# Patient Record
Sex: Female | Born: 1970
Health system: Southern US, Community
[De-identification: ages and names within clinical notes are randomized; demographics above are authoritative.]

## PROBLEM LIST (undated history)

## (undated) DIAGNOSIS — F458 Other somatoform disorders: Secondary | ICD-10-CM

## (undated) DIAGNOSIS — N200 Calculus of kidney: Secondary | ICD-10-CM

## (undated) DIAGNOSIS — Z87442 Personal history of urinary calculi: Secondary | ICD-10-CM

## (undated) DIAGNOSIS — G43909 Migraine, unspecified, not intractable, without status migrainosus: Secondary | ICD-10-CM

## (undated) DIAGNOSIS — T7840XA Allergy, unspecified, initial encounter: Secondary | ICD-10-CM

## (undated) DIAGNOSIS — F419 Anxiety disorder, unspecified: Secondary | ICD-10-CM

## (undated) DIAGNOSIS — R011 Cardiac murmur, unspecified: Secondary | ICD-10-CM

## (undated) DIAGNOSIS — M1990 Unspecified osteoarthritis, unspecified site: Secondary | ICD-10-CM

## (undated) DIAGNOSIS — M419 Scoliosis, unspecified: Secondary | ICD-10-CM

## (undated) HISTORY — DX: Calculus of kidney: N20.0

## (undated) HISTORY — DX: Anxiety disorder, unspecified: F41.9

## (undated) HISTORY — DX: Other somatoform disorders: F45.8

## (undated) HISTORY — DX: Allergy, unspecified, initial encounter: T78.40XA

## (undated) HISTORY — PX: EYE SURGERY: SHX253

## (undated) HISTORY — PX: TUBAL LIGATION: SHX77

## (undated) HISTORY — DX: Migraine, unspecified, not intractable, without status migrainosus: G43.909

## (undated) HISTORY — DX: Unspecified osteoarthritis, unspecified site: M19.90

## (undated) HISTORY — DX: Scoliosis, unspecified: M41.9

## (undated) HISTORY — DX: Cardiac murmur, unspecified: R01.1

## (undated) HISTORY — PX: WISDOM TOOTH EXTRACTION: SHX21

---
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
## 2016-03-13 ENCOUNTER — Encounter: Payer: Self-pay | Admitting: Emergency Medicine

## 2016-03-13 ENCOUNTER — Emergency Department: Payer: BLUE CROSS/BLUE SHIELD

## 2016-03-13 ENCOUNTER — Emergency Department
Admission: EM | Admit: 2016-03-13 | Discharge: 2016-03-13 | Disposition: A | Discharge status: Home / Self Care | Payer: BLUE CROSS/BLUE SHIELD | Attending: Emergency Medicine | Admitting: Emergency Medicine

## 2016-03-13 DIAGNOSIS — D259 Leiomyoma of uterus, unspecified: Secondary | ICD-10-CM | POA: Diagnosis not present

## 2016-03-13 DIAGNOSIS — N939 Abnormal uterine and vaginal bleeding, unspecified: Secondary | ICD-10-CM | POA: Diagnosis present

## 2016-03-13 LAB — URINALYSIS COMPLETE WITH MICROSCOPIC (ARMC ONLY)
BILIRUBIN URINE: NEGATIVE
Glucose, UA: NEGATIVE mg/dL
LEUKOCYTES UA: NEGATIVE
NITRITE: POSITIVE — AB
PH: 6 (ref 5.0–8.0)
Protein, ur: NEGATIVE mg/dL
SPECIFIC GRAVITY, URINE: 1.016 (ref 1.005–1.030)

## 2016-03-13 LAB — COMPREHENSIVE METABOLIC PANEL
ALT: 12 U/L — ABNORMAL LOW (ref 14–54)
AST: 19 U/L (ref 15–41)
Albumin: 4.4 g/dL (ref 3.5–5.0)
Alkaline Phosphatase: 41 U/L (ref 38–126)
Anion gap: 9 (ref 5–15)
BUN: 13 mg/dL (ref 6–20)
CHLORIDE: 105 mmol/L (ref 101–111)
CO2: 23 mmol/L (ref 22–32)
Calcium: 9.3 mg/dL (ref 8.9–10.3)
Creatinine, Ser: 0.69 mg/dL (ref 0.44–1.00)
Glucose, Bld: 91 mg/dL (ref 65–99)
POTASSIUM: 3.5 mmol/L (ref 3.5–5.1)
Sodium: 137 mmol/L (ref 135–145)
Total Bilirubin: 0.7 mg/dL (ref 0.3–1.2)
Total Protein: 7.4 g/dL (ref 6.5–8.1)

## 2016-03-13 LAB — POCT PREGNANCY, URINE: Preg Test, Ur: NEGATIVE

## 2016-03-13 LAB — CBC
HEMATOCRIT: 40.7 % (ref 35.0–47.0)
Hemoglobin: 13.4 g/dL (ref 12.0–16.0)
MCH: 30.9 pg (ref 26.0–34.0)
MCHC: 33 g/dL (ref 32.0–36.0)
MCV: 93.7 fL (ref 80.0–100.0)
PLATELETS: 230 10*3/uL (ref 150–440)
RBC: 4.34 MIL/uL (ref 3.80–5.20)
RDW: 12.7 % (ref 11.5–14.5)
WBC: 6.5 10*3/uL (ref 3.6–11.0)

## 2016-03-13 LAB — LIPASE, BLOOD: LIPASE: 29 U/L (ref 11–51)

## 2016-03-13 MED ORDER — OXYCODONE-ACETAMINOPHEN 5-325 MG PO TABS
1.0000 | ORAL_TABLET | Freq: Once | ORAL | Status: AC
  Administered 2016-03-13: 1 via ORAL
  Filled 2016-03-13: qty 1

## 2016-03-13 MED ORDER — TRAMADOL HCL 50 MG PO TABS: 50.0000 mg | ORAL_TABLET | Freq: Four times a day (QID) | ORAL | 0 refills | Status: AC | PRN

## 2016-03-13 NOTE — Discharge Instructions (Signed)
Please seek medical attention for any high fevers, chest pain, shortness of breath, change in behavior, persistent vomiting, bloody stool or any other new or concerning symptoms.  

## 2016-03-13 NOTE — ED Provider Notes (Signed)
[facility] Emergency Department Provider Note   ____________________________________________   I have reviewed the triage vital signs and the nursing notes.   HISTORY  Chief Complaint Abdominal Pain   History limited by: Not Limited   HPI Jacqueline Wong is a 45 y.o. female who presents to the emergency department today because of concern for vaginal bleeding and abdominal pain.She states that the pain is located in her lower abdomin. She has been having significant pain there. She last had a period roughly 10 months ago and presumed that she was going through menopause (also having hot flashes). States the pain is worse than it had been when she was having regular periods. She denies any dysuria or problems with defecation. No fevers.   History reviewed. No pertinent past medical history.  There are no active problems to display for this patient.   History reviewed. No pertinent surgical history.  Prior to Admission medications   Not on File    Allergies Review of patient's allergies indicates no known allergies.  History reviewed. No pertinent family history.  Social History Social History  Substance Use Topics  . Smoking status: Never Smoker  . Smokeless tobacco: Never Used  . Alcohol use Yes    Review of Systems  Constitutional: Negative for fever. Cardiovascular: Negative for chest pain. Respiratory: Negative for shortness of breath. Gastrointestinal: Positive for lower abdominal pain. Genitourinary: Negative for dysuria. Positive for vaginal bleeding. Musculoskeletal: Negative for back pain. Skin: Negative for rash. Neurological: Negative for headaches, focal weakness or numbness.  10-point ROS otherwise negative.  ____________________________________________   PHYSICAL EXAM:  VITAL SIGNS: ED Triage Vitals  Enc Vitals Group     BP 03/13/16 1728 120/77     Pulse Rate 03/13/16 1728 77     Resp 03/13/16 1728 18      Temp 03/13/16 1728 98.4 F (36.9 C)     Temp Source 03/13/16 1728 Oral     SpO2 03/13/16 1728 100 %     Weight 03/13/16 1729 110 lb (49.9 kg)     Height 03/13/16 1729 4\' 9"  (1.448 m)     Head Circumference --      Peak Flow --      Pain Score 03/13/16 1730 5   Constitutional: Alert and oriented. Well appearing and in no distress. Eyes: Conjunctivae are normal. Normal extraocular movements. ENT   Head: Normocephalic and atraumatic.   Nose: No congestion/rhinnorhea.   Mouth/Throat: Mucous membranes are moist.   Neck: No stridor. Hematological/Lymphatic/Immunilogical: No cervical lymphadenopathy. Cardiovascular: Normal rate, regular rhythm.  No murmurs, rubs, or gallops.  Respiratory: Normal respiratory effort without tachypnea nor retractions. Breath sounds are clear and equal bilaterally. No wheezes/rales/rhonchi. Gastrointestinal: Soft and tender to palpation in the lower abdomen. Genitourinary: Deferred Musculoskeletal: Normal range of motion in all extremities. No lower extremity edema. Neurologic:  Normal speech and language. No gross focal neurologic deficits are appreciated.  Skin:  Skin is warm, dry and intact. No rash noted. Psychiatric: Mood and affect are normal. Speech and behavior are normal. Patient exhibits appropriate insight and judgment.  ____________________________________________    LABS (pertinent positives/negatives)  Labs Reviewed  COMPREHENSIVE METABOLIC PANEL - Abnormal; Notable for the following:       Result Value   ALT 12 (*)    All other components within normal limits  URINALYSIS COMPLETEWITH MICROSCOPIC ([facility] ONLY) - Abnormal; Notable for the following:    Color, Urine YELLOW (*)    APPearance CLEAR (*)  Ketones, ur 1+ (*)    Hgb urine dipstick 3+ (*)    Nitrite POSITIVE (*)    Bacteria, UA MANY (*)    Squamous Epithelial / LPF 0-5 (*)    All other components within normal limits  LIPASE, BLOOD  CBC  POC URINE PREG, ED   POCT PREGNANCY, URINE     ____________________________________________   EKG  None  ____________________________________________    RADIOLOGY  US IMPRESSION:  1. Heterogeneous mass within the anterior uterine body/fundus,  likely an intramural fibroid.  2. Otherwise normal examination of the pelvis.      ____________________________________________   PROCEDURES  Procedures  ____________________________________________   INITIAL IMPRESSION / ASSESSMENT AND PLAN / ED COURSE  Pertinent labs & imaging results that were available during my care of the patient were reviewed by me and considered in my medical decision making (see chart for details).  [place] consistent with fibroid. Think this likely source of bleeding. hgb wnl. Vital signs without tachycardia or hypotension. Will give patient ob/gyn follow up information. Discussed return precautions with the patient.  ____________________________________________   FINAL CLINICAL IMPRESSION(S) / ED DIAGNOSES  Final diagnoses:  Vaginal bleeding  Uterine leiomyoma, unspecified location     Note: This dictation was prepared with Dragon dictation. Any transcriptional errors that result from this process are unintentional    Nance Pear, MD 03/13/16 2232

## 2016-03-13 NOTE — ED Notes (Signed)
Pt in via triage with complaints of pelvic and vaginal pain x 4 days, pt denies any itching/burning, denies any vaginal discharge.  Pt A/Ox4, no immediate distress noted at this time.

## 2016-03-13 NOTE — ED Triage Notes (Addendum)
Pt to ed with c/o vaginal bleeding and abd pain x 4 days.  States she has change pad x 5 today, however 2 days ago was changing pads every 20 minutes. Last period was 10 months prior to today. Reports nausea denies vomiting, denies diarrhea.

## 2016-03-15 ENCOUNTER — Encounter: Payer: Self-pay | Admitting: Obstetrics and Gynecology

## 2016-03-15 ENCOUNTER — Ambulatory Visit (INDEPENDENT_AMBULATORY_CARE_PROVIDER_SITE_OTHER): Payer: BLUE CROSS/BLUE SHIELD | Admitting: Obstetrics and Gynecology

## 2016-03-15 VITALS — BP 104/68 | HR 73 | Ht <= 58 in | Wt 108.8 lb

## 2016-03-15 DIAGNOSIS — N3001 Acute cystitis with hematuria: Secondary | ICD-10-CM | POA: Diagnosis not present

## 2016-03-15 DIAGNOSIS — R102 Pelvic and perineal pain unspecified side: Secondary | ICD-10-CM

## 2016-03-15 DIAGNOSIS — Z124 Encounter for screening for malignant neoplasm of cervix: Secondary | ICD-10-CM

## 2016-03-15 DIAGNOSIS — N939 Abnormal uterine and vaginal bleeding, unspecified: Secondary | ICD-10-CM

## 2016-03-15 MED ORDER — ACETAMINOPHEN-CODEINE #3 300-30 MG PO TABS: 1.0000 | ORAL_TABLET | Freq: Four times a day (QID) | ORAL | 0 refills | Status: DC | PRN

## 2016-03-15 MED ORDER — IBUPROFEN 800 MG PO TABS: 800.0000 mg | ORAL_TABLET | Freq: Three times a day (TID) | ORAL | 1 refills | Status: DC | PRN

## 2016-03-15 NOTE — Patient Instructions (Signed)

## 2016-03-15 NOTE — Progress Notes (Signed)
GYNECOLOGY CLINIC PROGRESS NOTE  Subjective:      Jacqueline Wong is an 45 y.o. [id number] woman who presents as a follow up from the Emergency Room 2 days ago for perimenopausal vs postmenopausal bleeding and pelvic pain. Patient notes that she has not had a menstrual cycle in ~ 10-12 months (unsure).  Prior to this, she was having 2-3 periods per year in the year prior. Pain has had a more sudden onset, occurring in the past 3-4 days. Patient notes she was told that she had fibroids, and could be the cause of her issues.  History of abnormal Pap smear: no.   Patient has not had a pap smear in "many years".  Denies urinary or bowel symptoms.   Menstrual History: OB History    Gravida Para Term Preterm AB Living   3       1     SAB TAB Ectopic Multiple Live Births   1       2      Menarche age: 62 Patient's last menstrual period was 03/13/2016.   Past Medical History:  Diagnosis Date  . Migraine     Family History  Problem Relation Age of Onset  . Thyroid cancer Sister    Past Surgical History:  Procedure Laterality Date  . CESAREAN SECTION     x 2  . TUBAL LIGATION      Social History   Social History  . Marital status: Married    Spouse name: N/A  . Number of children: N/A  . Years of education: N/A   Occupational History  . Not on file.   Social History Main Topics  . Smoking status: Never Smoker  . Smokeless tobacco: Never Used  . Alcohol use Yes  . Drug use: No  . Sexual activity: Yes    Birth control/ protection: Surgical   Other Topics Concern  . Not on file   Social History Narrative  . No narrative on file    Current Outpatient Prescriptions on File Prior to Visit  Medication Sig Dispense Refill  . traMADol (ULTRAM) 50 MG tablet Take 1 tablet (50 mg total) by mouth every 6 (six) hours as needed. 15 tablet 0   No current facility-administered medications on file prior to visit.     No Known Allergies   Review of Systems Pertinent  items noted in HPI and remainder of comprehensive ROS otherwise negative.    Objective:    BP 104/68 (BP Location: Left Arm, Patient Position: Sitting, Cuff Size: Normal)   Pulse 73   Ht 4\' 9"  (1.448 m)   Wt 108 lb 12.8 oz (49.4 kg)   LMP 03/13/2016   BMI 23.54 kg/m   General:   alert and no distress  Skin:    normal and no rash or abnormalities  Neck:  no adenopathy, no carotid bruit, no JVD, supple, symmetrical, trachea midline and thyroid not enlarged, symmetric, no tenderness/mass/nodules  Abdomen:  normal findings: bowel sounds normal, no masses palpable and soft and abnormal findings:  mild tenderness in the lower abdomen  Pelvic:  external genitalia normal, rectovaginal septum normal.  Vagina without discharge. Mild vaginal atrophy.  Cervix normal appearing, no lesions and no motion tenderness, stenotic os.  Uterus mobile, nontender, normal shape and size.  Adnexae non-palpable, nontender bilaterally.          Last labs:  Lab Results  Component Value Date   WBC 6.5 03/13/2016   HGB 13.4 03/13/2016  HCT 40.7 03/13/2016   MCV 93.7 03/13/2016   PLT 230 03/13/2016   Lab Results  Component Value Date   CREATININE 0.69 03/13/2016   BUN 13 03/13/2016   NA 137 03/13/2016   K 3.5 03/13/2016   CL 105 03/13/2016   CO2 23 03/13/2016    Urinalysis    Component Value Date/Time   COLORURINE YELLOW (A) 03/13/2016 1742   APPEARANCEUR CLEAR (A) 03/13/2016 1742   LABSPEC 1.016 03/13/2016 1742   PHURINE 6.0 03/13/2016 1742   GLUCOSEU NEGATIVE 03/13/2016 1742   HGBUR 3+ (A) 03/13/2016 1742   BILIRUBINUR NEGATIVE 03/13/2016 1742   KETONESUR 1+ (A) 03/13/2016 1742   PROTEINUR NEGATIVE 03/13/2016 1742   NITRITE POSITIVE (A) 03/13/2016 1742   LEUKOCYTESUR NEGATIVE 03/13/2016 1742     CLINICAL DATA:  Vaginal bleeding and pain for 45 days. First menstrual cycle in 10 months.  EXAM: TRANSABDOMINAL AND TRANSVAGINAL ULTRASOUND OF PELVIS  DOPPLER ULTRASOUND OF  OVARIES  TECHNIQUE: Both transabdominal and transvaginal ultrasound examinations of the pelvis were performed. Transabdominal technique was performed for global imaging of the pelvis including uterus, ovaries, adnexal regions, and pelvic cul-de-sac.  It was necessary to proceed with endovaginal exam following the transabdominal exam to visualize the uterus, endometrium, both ovaries and both adnexae. Color and duplex Doppler ultrasound was utilized to evaluate blood flow to the ovaries.  COMPARISON:  None.  FINDINGS: Uterus  Measurements: 8.5 x 4.4 x 6.0 cm. There is a heterogeneous focal area in the anterior uterine body measuring 1.9 x 1.5 x 2.2 cm.  Endometrium  Thickness: 6 mm.  No focal abnormality visualized.  Right ovary  Measurements: 2.4 x 1.5 x 1.6 cm. Normal appearance/no adnexal mass.  Left ovary  Measurements: 3.3 x 1.7 x 1.5 cm. Normal appearance/no adnexal mass.  Pulsed Doppler evaluation of both ovaries demonstrates normal low-resistance arterial and venous waveforms.  Other findings  No abnormal free fluid.  IMPRESSION: 1. Heterogeneous mass within the anterior uterine body/fundus, likely an intramural fibroid. 2. Otherwise normal examination of the pelvis.  Assessment:   Perimenopausal bleeding vs postmenopausal bleeding   Pelvic pain Fibroid uterus Nitrite positive UA   Plan:    - Blood tests: Estradiol, FSH, LH, Progesterone level and TSH. Will assess if patient is perimenopausal or postmenopausal.  If postmenopausal, patient may need further evaluation with endometrial biopsy.  If perimenopausal, may just be a menstrual cycle and no further workup needed. Can manage with supplemental hormonal therapy.    - Patient has not had a pap smear in many years.  Performed today.  - Nitrite positive on UA from ER. Can treat with Cipro.  May be cause of her pelvic pain.  Also given prescription for Vicodin as patient notes Tramadol did not help  with pain.  - Fibroid uterus, small fibroid noted, appears intramural, could contribute to abnormal bleeding if perimenopausal.   - Will notify patient of results by phone.    Rubie Maid, MD [facility]

## 2016-03-17 LAB — ESTRADIOL: Estradiol: 28.9 pg/mL

## 2016-03-17 LAB — FSH/LH
FSH: 5.7 m[IU]/mL
LH: 4.9 m[IU]/mL

## 2016-03-17 LAB — PROGESTERONE: Progesterone: 0.2 ng/mL

## 2016-03-17 LAB — TSH: TSH: 1.99 u[IU]/mL (ref 0.450–4.500)

## 2016-03-19 ENCOUNTER — Telehealth: Payer: Self-pay

## 2016-03-19 DIAGNOSIS — R399 Unspecified symptoms and signs involving the genitourinary system: Secondary | ICD-10-CM

## 2016-03-19 MED ORDER — NITROFURANTOIN MONOHYD MACRO 100 MG PO CAPS: 100.0000 mg | ORAL_CAPSULE | Freq: Two times a day (BID) | ORAL | 0 refills | Status: DC

## 2016-03-19 NOTE — Telephone Encounter (Signed)
Called pt informed her of information below, pt states that she is still in a lot of pain and does not believe that this level of pain would be caused by a UTI. Advised pt to come by and get RX for pain meds as she states she was not aware it was up front. Advised pt that we would treat her for UTI and if she was still having pain in a week to call back. Pt gave verbal understanding. RX for macrobid sent in.

## 2016-03-19 NOTE — Telephone Encounter (Signed)
-----   Message from Rubie Maid, MD sent at 03/19/2016  1:16 PM EDT ----- Please inform patient that her labs don't look quite like she has hit menopause yet, but are a fairly low (meaning she will likely be transitioning in the near future).  If she would like.  So she likely will not require any further workup. If she is having other symptoms like hot flushes, night sweats, she could be placed on supplemental hormones to boost her current levels, but if she is otherwise asymptomatic outside of the bleeding then she does not require any hormonal supplementation. Also, please inform patient of Nitrite positive urine test from the Emergency Room, she needs to be treated for a UTI (may be the cause of her pelvic pain).

## 2016-03-21 LAB — PAP IG AND HPV HIGH-RISK
HPV, HIGH-RISK: NEGATIVE
PAP Smear Comment: 0

## 2016-03-26 ENCOUNTER — Other Ambulatory Visit: Payer: Self-pay

## 2016-03-26 ENCOUNTER — Telehealth: Payer: Self-pay | Admitting: Obstetrics and Gynecology

## 2016-03-26 DIAGNOSIS — N858 Other specified noninflammatory disorders of uterus: Secondary | ICD-10-CM

## 2016-03-26 MED ORDER — ESTRADIOL 1 MG PO TABS: 1.0000 mg | ORAL_TABLET | Freq: Every day | ORAL | 0 refills | Status: DC

## 2016-03-26 NOTE — Telephone Encounter (Signed)
Thi spt called you back, she said she was at work and could not anser her phone. Please call her back.

## 2016-03-26 NOTE — Telephone Encounter (Signed)
See previous telephone encounter.

## 2016-03-26 NOTE — Telephone Encounter (Signed)
Pt called and was here last week or the week before and she had a UTI and she was given an antibiotic and she is still having the symptoms and wanted to know what she needs to do.

## 2016-03-26 NOTE — Telephone Encounter (Signed)
Called pt no answer. Unable to leave message as no voicemail is set up.  

## 2016-03-26 NOTE — Telephone Encounter (Signed)
Called pt had lengthy discussion with pt on Dr.Cherry's recommendation of taking Estridial 1mg  for 2 weeks to see if that will help resolve pt's pelvic pain. Pt states she will try 2 week trial of medication, pt also notes that she never came and got rx for pain medication, pt states she will come get it today. Pt also requests letter for work as she is in a lot of pain. Will provide. Pt also still c/o dysuria will leave urine sample for TOC also discussed with pt that she will likely need referral to urology.

## 2016-03-27 ENCOUNTER — Other Ambulatory Visit: Payer: Self-pay | Admitting: Obstetrics and Gynecology

## 2016-03-27 ENCOUNTER — Other Ambulatory Visit: Payer: Self-pay

## 2016-03-27 DIAGNOSIS — Z87440 Personal history of urinary (tract) infections: Secondary | ICD-10-CM

## 2016-03-28 ENCOUNTER — Telehealth: Payer: Self-pay | Admitting: Obstetrics and Gynecology

## 2016-03-28 DIAGNOSIS — Z7689 Persons encountering health services in other specified circumstances: Secondary | ICD-10-CM

## 2016-03-28 DIAGNOSIS — R1030 Lower abdominal pain, unspecified: Secondary | ICD-10-CM

## 2016-03-28 NOTE — Telephone Encounter (Signed)
Called pt she states that she has not had a bowel movement in about a week and a half. Pt states that she has tried OTC stool softeners with no relief. Advised pt on the use of warm prune juice, or fleet enema for immediate relief. Pt states that she will comply with this treatment, pt understands that if she does not have a BM today she needs to be seen in the ED. Pt also requests referral for GI and PCP. Will place.

## 2016-03-28 NOTE — Telephone Encounter (Signed)
Pt called and she is not sure what she needs to day, she said Dr Marcelline Mates is the only doctor she sees and she has not had a BM in 1 weeks, she has taken OTC stool softner and nothing is working, and she is unable to go to work due to the pain in her pelvic region and extending to her back so she is needing a work note.

## 2016-03-29 ENCOUNTER — Other Ambulatory Visit: Payer: BLUE CROSS/BLUE SHIELD

## 2016-03-29 ENCOUNTER — Other Ambulatory Visit: Payer: Self-pay

## 2016-03-29 ENCOUNTER — Telehealth: Payer: Self-pay | Admitting: Obstetrics and Gynecology

## 2016-03-29 DIAGNOSIS — Z87440 Personal history of urinary (tract) infections: Secondary | ICD-10-CM

## 2016-03-29 NOTE — Telephone Encounter (Signed)
Pt called and left office voicemail requesting a call back from you.

## 2016-03-29 NOTE — Telephone Encounter (Signed)
Called pt she states that she is still having a lot of back pain and wants the results of urine cx, informed pt that urine cx was sent out just this morning due to lab error. Pt gave verbal understanding. Offered pt appointment she declines. Will call back with urine cx results when they return.

## 2016-03-30 LAB — URINE CULTURE: Organism ID, Bacteria: NO GROWTH

## 2016-04-02 ENCOUNTER — Ambulatory Visit
Admission: RE | Admit: 2016-04-02 | Discharge: 2016-04-02 | Disposition: A | Discharge status: Home / Self Care | Payer: BLUE CROSS/BLUE SHIELD | Source: Ambulatory Visit | Attending: Unknown Physician Specialty | Admitting: Unknown Physician Specialty

## 2016-04-02 ENCOUNTER — Other Ambulatory Visit: Payer: Self-pay | Admitting: Unknown Physician Specialty

## 2016-04-02 ENCOUNTER — Telehealth: Payer: Self-pay

## 2016-04-02 DIAGNOSIS — R300 Dysuria: Secondary | ICD-10-CM

## 2016-04-02 DIAGNOSIS — R109 Unspecified abdominal pain: Secondary | ICD-10-CM

## 2016-04-02 DIAGNOSIS — N200 Calculus of kidney: Secondary | ICD-10-CM | POA: Insufficient documentation

## 2016-04-02 DIAGNOSIS — K5730 Diverticulosis of large intestine without perforation or abscess without bleeding: Secondary | ICD-10-CM | POA: Diagnosis not present

## 2016-04-02 NOTE — Telephone Encounter (Signed)
Sent pt mychart message informing her of negative urine culture.

## 2016-04-04 ENCOUNTER — Ambulatory Visit: Payer: BLUE CROSS/BLUE SHIELD | Admitting: Gastroenterology

## 2016-04-05 ENCOUNTER — Ambulatory Visit (INDEPENDENT_AMBULATORY_CARE_PROVIDER_SITE_OTHER): Payer: BLUE CROSS/BLUE SHIELD | Admitting: Urology

## 2016-04-05 ENCOUNTER — Encounter: Payer: Self-pay | Admitting: Urology

## 2016-04-05 VITALS — BP 131/84 | HR 93 | Ht <= 58 in | Wt 106.6 lb

## 2016-04-05 DIAGNOSIS — N200 Calculus of kidney: Secondary | ICD-10-CM

## 2016-04-05 DIAGNOSIS — M4180 Other forms of scoliosis, site unspecified: Secondary | ICD-10-CM | POA: Diagnosis not present

## 2016-04-05 DIAGNOSIS — N3946 Mixed incontinence: Secondary | ICD-10-CM | POA: Diagnosis not present

## 2016-04-05 LAB — URINALYSIS, COMPLETE
BILIRUBIN UA: NEGATIVE
Glucose, UA: NEGATIVE
KETONES UA: NEGATIVE
LEUKOCYTES UA: NEGATIVE
NITRITE UA: NEGATIVE
PH UA: 6 (ref 5.0–7.5)
Protein, UA: NEGATIVE
SPEC GRAV UA: 1.01 (ref 1.005–1.030)
UUROB: 1 mg/dL (ref 0.2–1.0)

## 2016-04-05 LAB — MICROSCOPIC EXAMINATION

## 2016-04-05 LAB — BLADDER SCAN AMB NON-IMAGING: SCAN RESULT: 60

## 2016-04-05 NOTE — Progress Notes (Signed)
04/05/2016 2:13 PM   Jacqueline Wong 1970/12/23 [id number]  Referring provider: Lorenza Evangelist, MD [address] [place], [place] [address]  Chief Complaint  Patient presents with  . New Patient (Initial Visit)    Kidney stones referred by Dr. Sabra Heck    HPI: Patient is a 45 year old Caucasian female who is referred by Dr. Sabra Heck for nephrolithiasis.  Patient states the onset of the pain was sudden and started two weeks ago.   She states the pain was sharp and lasted for several hours.  The pain was located in the left lower back and did not radiate.     The pain was a 10/10.  Nothing made the pain better.   Movement made the pain worse causing a sharp shooting sensation.    She did not have gross hematuria, fevers, chills, nausea or vomiting.  In the ED, she was given Percocet and Ultram.  Her UA in the ED was nitrite positive with many bacteria.    Her serum creatinine was 0.69 mg/dL.    CT renal stone study performed on 04/02/2016 noted intrarenal calculi on the left. No hydronephrosis or ureteral calculus on the left.  Probable leiomyomatous uterus with lobulation along the anterior rightward aspect of the uterus.  No bowel obstruction. No abscess. Occasional sigmoid diverticula without diverticulitis. Appendix region appears normal.  Question sludge in gallbladder.  Gallbladder wall not thickened.  I have independently reviewed the films.  Today, she is experiencing urinary urgency, dysuria, nocturia 1, mixed urinary incontinence.  Her incontinence is light as she uses 2 panty liners a day..  Her UA was unremarkable.  She was rubbing her left buttock walking to the restroom for a PVR.  This makes me think that this may be musculoskeletal in nature, such as a sciatic pain.    She does have a prior history of stones.  She states when she was 16, she had a basket procedure and they pushed the stone back up.    PMH: Past Medical History:    Diagnosis Date  . Anxiety   . Heart murmur   . Kidney stone   . Migraine     Surgical History: Past Surgical History:  Procedure Laterality Date  . CESAREAN SECTION     x 2  . TUBAL LIGATION      Home Medications:    Medication List       Accurate as of 04/05/16  2:13 PM. Always use your most recent med list.          acetaminophen-codeine 300-30 MG tablet Commonly known as:  TYLENOL #3 Take 1-2 tablets by mouth every 6 (six) hours as needed for moderate pain.   estradiol 1 MG tablet Commonly known as:  ESTRACE Take 1 tablet (1 mg total) by mouth daily.   HYDROcodone-acetaminophen 5-325 MG tablet Commonly known as:  NORCO/VICODIN Take by mouth.   ibuprofen 800 MG tablet Commonly known as:  ADVIL,MOTRIN Take 1 tablet (800 mg total) by mouth every 8 (eight) hours as needed.   nitrofurantoin (macrocrystal-monohydrate) 100 MG capsule Commonly known as:  MACROBID Take 1 capsule (100 mg total) by mouth 2 (two) times daily.   promethazine 25 MG tablet Commonly known as:  PHENERGAN Take by mouth.   traMADol 50 MG tablet Commonly known as:  ULTRAM Take 1 tablet (50 mg total) by mouth every 6 (six) hours as needed.       Allergies: No Known  Allergies  Family History: Family History  Problem Relation Age of Onset  . Thyroid cancer Sister   . Prostate cancer Neg Hx   . Kidney disease Neg Hx   . Bladder Cancer Neg Hx     Social History:  reports that she has never smoked. She has never used smokeless tobacco. She reports that she drinks alcohol. She reports that she does not use drugs.  ROS: UROLOGY Frequent Urination?: No Hard to postpone urination?: Yes Burning/pain with urination?: Yes Get up at night to urinate?: Yes Leakage of urine?: Yes Urine stream starts and stops?: No Trouble starting stream?: No Do you have to strain to urinate?: No Blood in urine?: Yes Urinary tract infection?: Yes Sexually transmitted disease?: No Injury to kidneys or  bladder?: No Painful intercourse?: No Weak stream?: No Currently pregnant?: No Vaginal bleeding?: No Last menstrual period?: 03/11/2016  Gastrointestinal Nausea?: Yes Vomiting?: Yes Indigestion/heartburn?: No Diarrhea?: No Constipation?: Yes  Constitutional Fever: No Night sweats?: Yes Weight loss?: No Fatigue?: No  Skin Skin rash/lesions?: No Itching?: No  Eyes Blurred vision?: No Double vision?: No  Ears/Nose/Throat Sore throat?: No Sinus problems?: No  Hematologic/Lymphatic Swollen glands?: No Easy bruising?: No  Cardiovascular Leg swelling?: No Chest pain?: No  Respiratory Cough?: No Shortness of breath?: No  Endocrine Excessive thirst?: No  Musculoskeletal Back pain?: Yes Joint pain?: No  Neurological Headaches?: Yes Dizziness?: No  Psychologic Depression?: No Anxiety?: Yes  Physical Exam: BP 131/84   Pulse 93   Ht 4\' 9"  (1.448 m)   Wt 106 lb 9.6 oz (48.4 kg)   LMP 03/13/2016   BMI 23.07 kg/m   Constitutional: Well nourished. Alert and oriented, No acute distress. HEENT: [place] AT, moist mucus membranes. Trachea midline, no masses. Cardiovascular: No clubbing, cyanosis, or edema. Respiratory: Normal respiratory effort, no increased work of breathing. GI: Abdomen is soft, non tender, non distended, no abdominal masses. Liver and spleen not palpable.  No hernias appreciated.  Stool sample for occult testing is not indicated.   GU: No CVA tenderness.  No bladder fullness or masses.   Skin: No rashes, bruises or suspicious lesions. Lymph: No cervical or inguinal adenopathy. Neurologic: Grossly intact, no focal deficits, moving all 4 extremities. Psychiatric: Normal mood and affect.  Laboratory Data: Lab Results  Component Value Date   WBC 6.5 03/13/2016   HGB 13.4 03/13/2016   HCT 40.7 03/13/2016   MCV 93.7 03/13/2016   PLT 230 03/13/2016    Lab Results  Component Value Date   CREATININE 0.69 03/13/2016    Lab Results    Component Value Date   TSH 1.990 03/15/2016    Lab Results  Component Value Date   AST 19 03/13/2016   Lab Results  Component Value Date   ALT 12 (L) 03/13/2016     Urinalysis Unremarkable.  See EPIC.    Pertinent Imaging: CLINICAL DATA:  Left flank pain with hematuria dysuria for 2 weeks  EXAM: CT ABDOMEN AND PELVIS WITHOUT CONTRAST  TECHNIQUE: Multidetector CT imaging of the abdomen and pelvis was performed following the standard protocol w adrenals appear normal bilaterally. Ithout oral or intravenous contrast material administration.  COMPARISON:  None  FINDINGS: Lower chest: Lung bases are clear.  Hepatobiliary: No focal liver lesions are apparent on this noncontrast enhanced study. There is suspected sludge in the gallbladder. The gallbladder wall is not thickened.  Pancreas: No pancreatic mass or inflammatory focus.  Spleen: No splenic lesions are evident.  Adrenals/Urinary Tract: Adrenals appear normal.  There is no renal mass on either side. There is no appreciable hydronephrosis on either side. There is a calculus in the mid left kidney posteriorly measuring 5 x 3 mm with a nearby second calculus in the posterior mid left kidney measuring 5 x 4 mm. No calculi are noted in the right kidney. There is no ureteral calculus on either side. There are multiple phleboliths in the pelvis as well as phleboliths near the proximal to mid right ureter but separate from the right ureter. Urinary bladder is midline with wall thickness within normal limits for degree of urinary bladder distention.  Stomach/Bowel: There are scattered sigmoid diverticula without diverticulitis. There is moderate stool in colon. There is no appreciable bowel wall or mesenteric thickening. There is no evident bowel obstruction. There is no free air. No portal venous air.  Vascular/Lymphatic: There is no demonstrable abdominal aortic aneurysm. No vascular lesions are evident  on this study beyond multiple calcified phleboliths. There is no appreciable adenopathy in the abdomen or pelvis.  Reproductive: Uterus is anteverted. The uterus anteriorly appears somewhat lobulated, likely due to leiomyomatous change. Beyond probable intrauterine leiomyoma, there is no demonstrable pelvic mass or pelvic fluid collection.  Other: Appendix appears normal. There is no ascites or abscess in the abdomen or pelvis.  Musculoskeletal: There is a prominent Schmorl's node along the anterior superior aspect of the T12 vertebral body. There are no blastic or lytic bone lesions. There is lumbar levoscoliosis. There are intramuscular or abdominal wall lesions.  IMPRESSION: Intrarenal calculi on the left. No hydronephrosis or ureteral calculus on the left.  Probable leiomyomatous uterus with lobulation along the anterior rightward aspect of the uterus.  No bowel obstruction. No abscess. Occasional sigmoid diverticula without diverticulitis. Appendix region appears normal.  Question sludge in gallbladder.  Gallbladder wall not thickened.   Electronically Signed   By: Lowella Grip III M.D.   On: 04/02/2016 09:28  Assessment & Plan:    1. Kidney stones  - not the cause of her pain    2. Lumbar levoscoliosis  - patient's pain may to musculoskeletal in nature  - advised to obtain a PCP, given names of clinics accepting new patients  - Patient left the office very tearful as she states she feels she is getting around, I again highly encouraged her to obtain a primary care physician so that they would be able to direct her healthcare  3. Mixed incontinence  - Urinalysis, Complete  - CULTURE, URINE COMPREHENSIVE  - not bothersome to the patient at this time   Return for pending urine culture.  These notes generated with voice recognition software. I apologize for typographical errors.  Zara Council, [facility] [address], [place], [place] [address] [phone]

## 2016-04-08 LAB — CULTURE, URINE COMPREHENSIVE

## 2016-04-09 ENCOUNTER — Telehealth: Payer: Self-pay

## 2016-04-09 NOTE — Telephone Encounter (Signed)
-----   Message from Nori Riis, PA-C sent at 04/08/2016 11:57 PM EST ----- Please let the patient know that her urine culture was negative.

## 2016-04-09 NOTE — Telephone Encounter (Signed)
Spoke with pt in reference to -ucx. Pt voiced understanding.  

## 2016-04-12 ENCOUNTER — Encounter: Payer: Self-pay | Admitting: Obstetrics and Gynecology

## 2016-04-15 ENCOUNTER — Telehealth: Payer: Self-pay | Admitting: Urology

## 2016-04-15 NOTE — Telephone Encounter (Signed)
Would you find out if Jacqueline Wong has been able to acquire a primary care physician?

## 2016-04-16 NOTE — Telephone Encounter (Signed)
Pt stated that she has been able to see Jacqueline Wong with [facility].

## 2016-10-12 ENCOUNTER — Ambulatory Visit: Payer: BLUE CROSS/BLUE SHIELD | Admitting: Family Medicine

## 2017-08-30 ENCOUNTER — Encounter: Payer: Self-pay | Admitting: Physician Assistant

## 2017-08-30 ENCOUNTER — Ambulatory Visit: Payer: BLUE CROSS/BLUE SHIELD | Admitting: Physician Assistant

## 2017-08-30 VITALS — BP 110/80 | HR 84 | Temp 97.9°F | Resp 16 | Wt 108.6 lb

## 2017-08-30 DIAGNOSIS — R110 Nausea: Secondary | ICD-10-CM

## 2017-08-30 DIAGNOSIS — Z91018 Allergy to other foods: Secondary | ICD-10-CM

## 2017-08-30 DIAGNOSIS — G43001 Migraine without aura, not intractable, with status migrainosus: Secondary | ICD-10-CM | POA: Diagnosis not present

## 2017-08-30 DIAGNOSIS — J302 Other seasonal allergic rhinitis: Secondary | ICD-10-CM

## 2017-08-30 MED ORDER — ONDANSETRON HCL 4 MG PO TABS: 4.0000 mg | ORAL_TABLET | Freq: Three times a day (TID) | ORAL | 5 refills | Status: DC | PRN

## 2017-08-30 MED ORDER — SUMATRIPTAN SUCCINATE 50 MG PO TABS: 50.0000 mg | ORAL_TABLET | ORAL | 0 refills | Status: DC | PRN

## 2017-08-30 MED ORDER — AMITRIPTYLINE HCL 25 MG PO TABS: 25.0000 mg | ORAL_TABLET | Freq: Every day | ORAL | 0 refills | Status: DC

## 2017-08-30 NOTE — Progress Notes (Signed)
Patient: Jacqueline Wong Female    DOB: Dec 06, 1970   47 y.o.   MRN: [id number] Visit Date: 08/30/2017  Today's Provider: Mar Daring, PA-C   Chief Complaint  Patient presents with  . Establish Care   Subjective:    HPI Patient here today to establish care. Has not had a PCP since moving here 2 years ago.   Jacqueline Wong is a 47 yr old female that comes to the office today with a few complaints. Main complaint is worsening migraines. She has had migraines since she was a teenager. She is normally able to treat with 2 tylenol and an IBU. This has started to not work as well. She reports having sensitivity to smell prior to onset. She has associated nausea and photophobia. She is also noticing more pressure behind her eyes. She does have glasses but has not had an official eye exam in years. She has not had any migraine preventative medications since she was a kid. She cannot remember exactly what she has been on in the past. Stress is a trigger for her migraines.   She is also having worsening allergies, seasonally and food. She is requesting to be referred for allergy testing to see what she is allergic to so she can try to avoid.   She does have worsening anxiety, sleep disturbance. She reports external stressors as source. Reports she is now starting to grind her teeth. She does not have an established dentist locally.   Does have remote history of kidney stone as a teenager and one 2 years ago.   Also has lumbar scoliosis with DDD. She is followed by Dr. Candelaria Stagers with [facility]. He has recommended NSAIDs, muscle relaxers and PT. Patient has agreed to PT. She also does yoga 3-4 times per week.   Last pap was in 2017 with Dr. Marcelline Mates. Noted to be normal. No previous mammogram on file.      No Known Allergies  No current outpatient medications on file.  Review of Systems  Constitutional: Positive for diaphoresis and fatigue.  HENT: Positive for  congestion, rhinorrhea and sinus pressure.   Eyes: Positive for itching and visual disturbance.  Respiratory: Negative.   Cardiovascular: Negative.   Gastrointestinal: Positive for abdominal distention.  Endocrine: Positive for heat intolerance.  Genitourinary: Negative.   Musculoskeletal: Positive for arthralgias, back pain, neck pain and neck stiffness.  Skin: Negative.   Allergic/Immunologic: Positive for environmental allergies and food allergies.  Neurological: Positive for speech difficulty ("During and after migraine") and headaches.  Hematological: Negative.   Psychiatric/Behavioral: Positive for agitation and sleep disturbance. The patient is nervous/anxious.     Social History   Tobacco Use  . Smoking status: Never Smoker  . Smokeless tobacco: Never Used  Substance Use Topics  . Alcohol use: Yes    Alcohol/week: 1.8 oz    Types: 2 Glasses of wine, 1 Shots of liquor per week   Objective:   BP 110/80 (BP Location: Left Arm, Patient Position: Sitting, Cuff Size: Normal)   Pulse 84   Temp 97.9 F (36.6 C) (Oral)   Resp 16   Wt 108 lb 9.6 oz (49.3 kg)   SpO2 98%   BMI 23.50 kg/m    Physical Exam  Constitutional: She is oriented to person, place, and time. She appears well-developed and well-nourished. No distress.  Neck: Normal range of motion. Neck supple.  Cardiovascular: Normal rate, regular rhythm and normal heart sounds. Exam reveals no  gallop and no friction rub.  No murmur heard. Pulmonary/Chest: Effort normal and breath sounds normal. No respiratory distress. She has no wheezes. She has no rales.  Neurological: She is alert and oriented to person, place, and time.  Skin: She is not diaphoretic.  Psychiatric: Her speech is normal and behavior is normal. Judgment and thought content normal. Her mood appears anxious. Cognition and memory are normal. She exhibits a depressed mood.  Vitals reviewed.      Assessment & Plan:     1. Encounter to establish  care No previous PCP.  2. Food allergy Referral placed to ENT for allergy testing at patient request.  - Ambulatory referral to ENT  3. Seasonal allergies See above medical treatment plan. - Ambulatory referral to ENT  4. Migraine without aura and with status migrainosus, not intractable Worsening. Will start amitriptyline as below for prevention and Sumatriptan for breakthrough migraines. She is to call if symptoms worsen or if she has adverse reaction to the medications. Zofran also sent for nausea associated with migraine. I will see her back in 4 weeks for recheck.  - amitriptyline (ELAVIL) 25 MG tablet; Take 1 tablet (25 mg total) by mouth at bedtime.  Dispense: 30 tablet; Refill: 0 - SUMAtriptan (IMITREX) 50 MG tablet; Take 1 tablet (50 mg total) by mouth every 2 (two) hours as needed for migraine. May repeat in 2 hours if headache persists or recurs.  Dispense: 10 tablet; Refill: 0 - ondansetron (ZOFRAN) 4 MG tablet; Take 1 tablet (4 mg total) by mouth every 8 (eight) hours as needed for nausea or vomiting.  Dispense: 20 tablet; Refill: 5  5. Nausea See above medical treatment plan. - ondansetron (ZOFRAN) 4 MG tablet; Take 1 tablet (4 mg total) by mouth every 8 (eight) hours as needed for nausea or vomiting.  Dispense: 20 tablet; Refill: [place], PA-C  [facility]

## 2017-08-30 NOTE — Patient Instructions (Signed)
Sumatriptan tablets What is this medicine? SUMATRIPTAN (soo ma TRIP tan) is used to treat migraines with or without aura. An aura is a strange feeling or visual disturbance that warns you of an attack. It is not used to prevent migraines. This medicine may be used for other purposes; ask your health care provider or pharmacist if you have questions. COMMON BRAND NAME(S): Imitrex, Migraine Pack What should I tell my health care provider before I take this medicine? They need to know if you have any of these conditions: -circulation problems in fingers and toes -diabetes -heart disease -high blood pressure -high cholesterol -history of irregular heartbeat -history of stroke -kidney disease -liver disease -postmenopausal or surgical removal of uterus and ovaries -seizures -smoke tobacco -stomach or intestine problems -an unusual or allergic reaction to sumatriptan, other medicines, foods, dyes, or preservatives -pregnant or trying to get pregnant -breast-feeding How should I use this medicine? Take this medicine by mouth with a glass of water. Follow the directions on the prescription label. This medicine is taken at the first symptoms of a migraine. It is not for everyday use. If your migraine headache returns after one dose, you can take another dose as directed. You must leave at least 2 hours between doses, and do not take more than 100 mg as a single dose. Do not take more than 200 mg total in any 24 hour period. If there is no improvement at all after the first dose, do not take a second dose without talking to your doctor or health care professional. Do not take your medicine more often than directed. Talk to your pediatrician regarding the use of this medicine in children. Special care may be needed. Overdosage: If you think you have taken too much of this medicine contact a poison control center or emergency room at once. NOTE: This medicine is only for you. Do not share this  medicine with others. What if I miss a dose? This does not apply; this medicine is not for regular use. What may interact with this medicine? Do not take this medicine with any of the following medicines: -cocaine -ergot alkaloids like dihydroergotamine, ergonovine, ergotamine, methylergonovine -feverfew -MAOIs like Carbex, Eldepryl, Marplan, Nardil, and Parnate -other medicines for migraine headache like almotriptan, eletriptan, frovatriptan, naratriptan, rizatriptan, zolmitriptan -tryptophan This medicine may also interact with the following medications: -certain medicines for depression, anxiety, or psychotic disturbances This list may not describe all possible interactions. Give your health care provider a list of all the medicines, herbs, non-prescription drugs, or dietary supplements you use. Also tell them if you smoke, drink alcohol, or use illegal drugs. Some items may interact with your medicine. What should I watch for while using this medicine? Only take this medicine for a migraine headache. Take it if you get warning symptoms or at the start of a migraine attack. It is not for regular use to prevent migraine attacks. You may get drowsy or dizzy. Do not drive, use machinery, or do anything that needs mental alertness until you know how this medicine affects you. To reduce dizzy or fainting spells, do not sit or stand up quickly, especially if you are an older patient. Alcohol can increase drowsiness, dizziness and flushing. Avoid alcoholic drinks. Smoking cigarettes may increase the risk of heart-related side effects from using this medicine. If you take migraine medicines for 10 or more days a month, your migraines may get worse. Keep a diary of headache days and medicine use. Contact your healthcare professional if your  migraine attacks occur more frequently. What side effects may I notice from receiving this medicine? Side effects that you should report to your doctor or health  care professional as soon as possible: -allergic reactions like skin rash, itching or hives, swelling of the face, lips, or tongue -bloody or watery diarrhea -hallucination, loss of contact with reality -pain, tingling, numbness in the face, hands, or feet -seizures -signs and symptoms of a blood clot such as breathing problems; changes in vision; chest pain; severe, sudden headache; pain, swelling, warmth in the leg; trouble speaking; sudden numbness or weakness of the face, arm, or leg -signs and symptoms of a dangerous change in heartbeat or heart rhythm like chest pain; dizziness; fast or irregular heartbeat; palpitations, feeling faint or lightheaded; falls; breathing problems -signs and symptoms of a stroke like changes in vision; confusion; trouble speaking or understanding; severe headaches; sudden numbness or weakness of the face, arm, or leg; trouble walking; dizziness; loss of balance or coordination -stomach pain Side effects that usually do not require medical attention (report to your doctor or health care professional if they continue or are bothersome): -changes in taste -facial flushing -headache -muscle cramps -muscle pain -nausea, vomiting -weak or tired This list may not describe all possible side effects. Call your doctor for medical advice about side effects. You may report side effects to FDA at 1-800-FDA-1088. Where should I keep my medicine? Keep out of the reach of children. Store at room temperature between 2 and 30 degrees C (36 and 86 degrees F). Throw away any unused medicine after the expiration date. NOTE: This sheet is a summary. It may not cover all possible information. If you have questions about this medicine, talk to your doctor, pharmacist, or health care provider.  2018 Elsevier/Gold Standard (2015-06-16 12:38:23) Amitriptyline tablets What is this medicine? AMITRIPTYLINE (a mee TRIP ti leen) is used to treat depression. This medicine may be used  for other purposes; ask your health care provider or pharmacist if you have questions. COMMON BRAND NAME(S): Elavil, Vanatrip What should I tell my health care provider before I take this medicine? They need to know if you have any of these conditions: -an alcohol problem -asthma, difficulty breathing -bipolar disorder or schizophrenia -difficulty passing urine, prostate trouble -glaucoma -heart disease or previous heart attack -liver disease -over active thyroid -seizures -thoughts or plans of suicide, a previous suicide attempt, or family history of suicide attempt -an unusual or allergic reaction to amitriptyline, other medicines, foods, dyes, or preservatives -pregnant or trying to get pregnant -breast-feeding How should I use this medicine? Take this medicine by mouth with a drink of water. Follow the directions on the prescription label. You can take the tablets with or without food. Take your medicine at regular intervals. Do not take it more often than directed. Do not stop taking this medicine suddenly except upon the advice of your doctor. Stopping this medicine too quickly may cause serious side effects or your condition may worsen. A special MedGuide will be given to you by the pharmacist with each prescription and refill. Be sure to read this information carefully each time. Talk to your pediatrician regarding the use of this medicine in children. Special care may be needed. Overdosage: If you think you have taken too much of this medicine contact a poison control center or emergency room at once. NOTE: This medicine is only for you. Do not share this medicine with others. What if I miss a dose? If you miss a dose,  take it as soon as you can. If it is almost time for your next dose, take only that dose. Do not take double or extra doses. What may interact with this medicine? Do not take this medicine with any of the following medications: -arsenic trioxide -certain medicines  used to regulate abnormal heartbeat or to treat other heart conditions -cisapride -droperidol -halofantrine -linezolid -MAOIs like Carbex, Eldepryl, Marplan, Nardil, and Parnate -methylene blue -other medicines for mental depression -phenothiazines like perphenazine, thioridazine and chlorpromazine -pimozide -probucol -procarbazine -sparfloxacin -St. John's Wort -ziprasidone This medicine may also interact with the following medications: -atropine and related drugs like hyoscyamine, scopolamine, tolterodine and others -barbiturate medicines for inducing sleep or treating seizures, like phenobarbital -cimetidine -disulfiram -ethchlorvynol -thyroid hormones such as levothyroxine This list may not describe all possible interactions. Give your health care provider a list of all the medicines, herbs, non-prescription drugs, or dietary supplements you use. Also tell them if you smoke, drink alcohol, or use illegal drugs. Some items may interact with your medicine. What should I watch for while using this medicine? Tell your doctor if your symptoms do not get better or if they get worse. Visit your doctor or health care professional for regular checks on your progress. Because it may take several weeks to see the full effects of this medicine, it is important to continue your treatment as prescribed by your doctor. Patients and their families should watch out for new or worsening thoughts of suicide or depression. Also watch out for sudden changes in feelings such as feeling anxious, agitated, panicky, irritable, hostile, aggressive, impulsive, severely restless, overly excited and hyperactive, or not being able to sleep. If this happens, especially at the beginning of treatment or after a change in dose, call your health care professional. Jacqueline Wong may get drowsy or dizzy. Do not drive, use machinery, or do anything that needs mental alertness until you know how this medicine affects you. Do not stand  or sit up quickly, especially if you are an older patient. This reduces the risk of dizzy or fainting spells. Alcohol may interfere with the effect of this medicine. Avoid alcoholic drinks. Do not treat yourself for coughs, colds, or allergies without asking your doctor or health care professional for advice. Some ingredients can increase possible side effects. Your mouth may get dry. Chewing sugarless gum or sucking hard candy, and drinking plenty of water will help. Contact your doctor if the problem does not go away or is severe. This medicine may cause dry eyes and blurred vision. If you wear contact lenses you may feel some discomfort. Lubricating drops may help. See your eye doctor if the problem does not go away or is severe. This medicine can cause constipation. Try to have a bowel movement at least every 2 to 3 days. If you do not have a bowel movement for 3 days, call your doctor or health care professional. This medicine can make you more sensitive to the sun. Keep out of the sun. If you cannot avoid being in the sun, wear protective clothing and use sunscreen. Do not use sun lamps or tanning beds/booths. What side effects may I notice from receiving this medicine? Side effects that you should report to your doctor or health care professional as soon as possible: -allergic reactions like skin rash, itching or hives, swelling of the face, lips, or tongue -anxious -breathing problems -changes in vision -confusion -elevated mood, decreased need for sleep, racing thoughts, impulsive behavior -eye pain -fast, irregular heartbeat -feeling  faint or lightheaded, falls -feeling agitated, angry, or irritable -fever with increased sweating -hallucination, loss of contact with reality -seizures -stiff muscles -suicidal thoughts or other mood changes -tingling, pain, or numbness in the feet or hands -trouble passing urine or change in the amount of urine -trouble sleeping -unusually weak or  tired -vomiting -yellowing of the eyes or skin Side effects that usually do not require medical attention (report to your doctor or health care professional if they continue or are bothersome): -change in sex drive or performance -change in appetite or weight -constipation -dizziness -dry mouth -nausea -tired -tremors -upset stomach This list may not describe all possible side effects. Call your doctor for medical advice about side effects. You may report side effects to FDA at 1-800-FDA-1088. Where should I keep my medicine? Keep out of the reach of children. Store at room temperature between 20 and 25 degrees C (68 and 77 degrees F). Throw away any unused medicine after the expiration date. NOTE: This sheet is a summary. It may not cover all possible information. If you have questions about this medicine, talk to your doctor, pharmacist, or health care provider.  2018 Elsevier/Gold Standard (2015-10-14 12:14:15)

## 2017-09-05 ENCOUNTER — Telehealth: Payer: Self-pay | Admitting: Physician Assistant

## 2017-09-05 NOTE — Telephone Encounter (Signed)
Pt stated that she was returning Sarah's call and request a call back. Please advise. Thanks TNP

## 2017-09-17 ENCOUNTER — Encounter: Payer: Self-pay | Admitting: Physician Assistant

## 2017-09-25 ENCOUNTER — Other Ambulatory Visit: Payer: Self-pay | Admitting: Physician Assistant

## 2017-09-25 DIAGNOSIS — G43001 Migraine without aura, not intractable, with status migrainosus: Secondary | ICD-10-CM

## 2017-09-27 ENCOUNTER — Encounter: Payer: Self-pay | Admitting: Physician Assistant

## 2017-09-27 ENCOUNTER — Ambulatory Visit: Payer: BLUE CROSS/BLUE SHIELD | Admitting: Physician Assistant

## 2017-09-27 VITALS — BP 110/76 | HR 93 | Temp 98.4°F | Resp 16 | Wt 110.0 lb

## 2017-09-27 DIAGNOSIS — N951 Menopausal and female climacteric states: Secondary | ICD-10-CM

## 2017-09-27 DIAGNOSIS — H539 Unspecified visual disturbance: Secondary | ICD-10-CM | POA: Diagnosis not present

## 2017-09-27 DIAGNOSIS — G43909 Migraine, unspecified, not intractable, without status migrainosus: Secondary | ICD-10-CM | POA: Insufficient documentation

## 2017-09-27 DIAGNOSIS — G43109 Migraine with aura, not intractable, without status migrainosus: Secondary | ICD-10-CM

## 2017-09-27 DIAGNOSIS — J302 Other seasonal allergic rhinitis: Secondary | ICD-10-CM | POA: Diagnosis not present

## 2017-09-27 DIAGNOSIS — N200 Calculus of kidney: Secondary | ICD-10-CM | POA: Diagnosis not present

## 2017-09-27 MED ORDER — AMITRIPTYLINE HCL 50 MG PO TABS: 50.0000 mg | ORAL_TABLET | Freq: Every day | ORAL | 0 refills | Status: DC

## 2017-09-27 MED ORDER — VENLAFAXINE HCL ER 37.5 MG PO CP24: 37.5000 mg | ORAL_CAPSULE | Freq: Every day | ORAL | 1 refills | Status: DC

## 2017-09-27 MED ORDER — SUMATRIPTAN SUCCINATE 100 MG PO TABS: ORAL_TABLET | ORAL | 5 refills | Status: DC

## 2017-09-27 MED ORDER — CYCLOBENZAPRINE HCL 5 MG PO TABS: 5.0000 mg | ORAL_TABLET | Freq: Every day | ORAL | 5 refills | Status: DC

## 2017-09-27 NOTE — Progress Notes (Signed)
Patient: Jacqueline Wong Female    DOB: 08/03/1970   47 y.o.   MRN: [id number] Visit Date: 09/27/2017  Today's Provider: Mar Daring, PA-C   Chief Complaint  Patient presents with  . Follow-up   Subjective:    HPI  Follow up for Migraine  The patient was last seen for this 4 months ago. Changes made at last visit include start Amitriptyline.  She reports excellent compliance with treatment. She feels that condition is Unchanged. She is not having side effects.  Patient reports still having migraines during the day. Patient reports continues shoulder pain, patient reports that Tawanna Sat was going to prescribe Flexeril if symptoms did not improve. ------------------------------------------------------------------------------------ Patient C/O cough and clearing throat often. Patient denies any fever, ear pain or sore throat. Patient reports she has seen allergist and has allergy to grass and trees.   Patient reports night sweats and is requesting a prescription to control sweats. Patient reports she has taken estradiol 1 mg tablet. Patient reports go symptom control.   Patient C/O kidney stones x's several days. Patient denies any UTI symptoms.      No Known Allergies   Current Outpatient Medications:  .  amitriptyline (ELAVIL) 25 MG tablet, TAKE 1 TABLET(25 MG) BY MOUTH AT BEDTIME, Disp: 90 tablet, Rfl: 1 .  ondansetron (ZOFRAN) 4 MG tablet, Take 1 tablet (4 mg total) by mouth every 8 (eight) hours as needed for nausea or vomiting., Disp: 20 tablet, Rfl: 5 .  SUMAtriptan (IMITREX) 50 MG tablet, Take 1 tablet (50 mg total) by mouth every 2 (two) hours as needed for migraine. May repeat in 2 hours if headache persists or recurs., Disp: 10 tablet, Rfl: 0  Review of Systems  Constitutional: Positive for diaphoresis.  HENT: Positive for congestion.   Eyes:       Eye heaviness and pressure  Respiratory: Positive for cough.   Cardiovascular: Negative.     Gastrointestinal: Positive for nausea.  Genitourinary: Positive for flank pain.  Musculoskeletal: Positive for back pain (chronic; scoliosis), myalgias and neck pain.  Neurological: Positive for headaches. Negative for dizziness.    Social History   Tobacco Use  . Smoking status: Never Smoker  . Smokeless tobacco: Never Used  Substance Use Topics  . Alcohol use: Yes    Alcohol/week: 1.8 oz    Types: 2 Glasses of wine, 1 Shots of liquor per week   Objective:   BP 110/76 (BP Location: Left Arm, Patient Position: Sitting, Cuff Size: Normal)   Pulse 93   Temp 98.4 F (36.9 C) (Oral)   Resp 16   Wt 110 lb (49.9 kg)   LMP 03/13/2016   SpO2 98%   BMI 23.80 kg/m  Vitals:   09/27/17 0845  BP: 110/76  Pulse: 93  Resp: 16  Temp: 98.4 F (36.9 C)  TempSrc: Oral  SpO2: 98%  Weight: 110 lb (49.9 kg)     Physical Exam  Constitutional: She appears well-developed and well-nourished. No distress.  Neck: Normal range of motion. Neck supple. No tracheal deviation present. No thyromegaly present.  Cardiovascular: Normal rate, regular rhythm and normal heart sounds. Exam reveals no gallop and no friction rub.  No murmur heard. Pulmonary/Chest: Effort normal and breath sounds normal. No respiratory distress. She has no wheezes. She has no rales.  Lymphadenopathy:    She has no cervical adenopathy.  Skin: She is not diaphoretic.  Vitals reviewed.       Assessment & Plan:  1. Migraine with aura and without status migrainosus, not intractable Increase amitriptyline to 50mg  and may increase to 100mg  if needed. Sumatriptan increased to 100mg  tab for breakthrough. Flexeril given for muscle tension in neck. Migraines seem to be related to tension headache. I will see her back in 4 weeks to recheck.  - SUMAtriptan (IMITREX) 100 MG tablet; Take 1 tab PO at migraine onset. May repeat in 2 hours if headache persists or recurs. Max 2 tab daily  Dispense: 10 tablet; Refill: 5 -  amitriptyline (ELAVIL) 50 MG tablet; Take 1 tablet (50 mg total) by mouth at bedtime. May increase to 2 tabs PO qhs if needed  Dispense: 60 tablet; Refill: 0 - cyclobenzaprine (FLEXERIL) 5 MG tablet; Take 1 tablet (5 mg total) by mouth at bedtime.  Dispense: 30 tablet; Refill: 5  2. Menopausal syndrome (hot flashes) Worsening. Discussed risks of HRT and she would prefer to try non-hormonal treatments first. Venlafaxine sent in as below. I will see her back in 4 weeks to recheck.  - venlafaxine XR (EFFEXOR-XR) 37.5 MG 24 hr capsule; Take 1 capsule (37.5 mg total) by mouth daily with breakfast.  Dispense: 90 capsule; Refill: 1  3. Visual changes Feels heaviness and pressure of the eyes and feels this may be contributing to her migraines as well. No current eye doctor. Referral placed to ophthalmology as below for further evaluation.  - Ambulatory referral to Ophthalmology  4. Kidney stones Known history. Has seen Urology in the past but does not desire referral at this time. Advised to continue to push fluids.   5. Seasonal allergies Desires testing for food allergies. She has seen Dr. Pryor Ochoa and was tested for environmental allergies but not foods. She is going to call them back to see if they will test for food allergies.        Mar Daring, PA-C  [facility]

## 2017-09-27 NOTE — Patient Instructions (Signed)
Loratadine (claritin), fexofenadine (allegra), cetirizine (zyrtec), Xyzal-allergy medications Flonase Sensimist for allergies and post nasal drainage  Menopause Menopause is the normal time of life when menstrual periods stop completely. Menopause is complete when you have missed 12 consecutive menstrual periods. It usually occurs between the ages of 86 years and 30 years. Very rarely does a woman develop menopause before the age of 21 years. At menopause, your ovaries stop producing the female hormones estrogen and progesterone. This can cause undesirable symptoms and also affect your health. Sometimes the symptoms may occur 4-5 years before the menopause begins. There is no relationship between menopause and:  Oral contraceptives.  Number of children you had.  Race.  The age your menstrual periods started (menarche).  Heavy smokers and very thin women may develop menopause earlier in life. What are the causes?  The ovaries stop producing the female hormones estrogen and progesterone. Other causes include:  Surgery to remove both ovaries.  The ovaries stop functioning for no known reason.  Tumors of the pituitary gland in the brain.  Medical disease that affects the ovaries and hormone production.  Radiation treatment to the abdomen or pelvis.  Chemotherapy that affects the ovaries.  What are the signs or symptoms?  Hot flashes.  Night sweats.  Decrease in sex drive.  Vaginal dryness and thinning of the vagina causing painful intercourse.  Dryness of the skin and developing wrinkles.  Headaches.  Tiredness.  Irritability.  Memory problems.  Weight gain.  Bladder infections.  Hair growth of the face and chest.  Infertility. More serious symptoms include:  Loss of bone (osteoporosis) causing breaks (fractures).  Depression.  Hardening and narrowing of the arteries (atherosclerosis) causing heart attacks and strokes.  How is this diagnosed?  When the  menstrual periods have stopped for 12 straight months.  Physical exam.  Hormone studies of the blood. How is this treated? There are many treatment choices and nearly as many questions about them. The decisions to treat or not to treat menopausal changes is an individual choice made with your health care provider. Your health care provider can discuss the treatments with you. Together, you can decide which treatment will work best for you. Your treatment choices may include:  Hormone therapy (estrogen and progesterone).  Non-hormonal medicines.  Treating the individual symptoms with medicine (for example antidepressants for depression).  Herbal medicines that may help specific symptoms.  Counseling by a psychiatrist or psychologist.  Group therapy.  Lifestyle changes including: ? Eating healthy. ? Regular exercise. ? Limiting caffeine and alcohol. ? Stress management and meditation.  No treatment.  Follow these instructions at home:  Take the medicine your health care provider gives you as directed.  Get plenty of sleep and rest.  Exercise regularly.  Eat a diet that contains calcium (good for the bones) and soy products (acts like estrogen hormone).  Avoid alcoholic beverages.  Do not smoke.  If you have hot flashes, dress in layers.  Take supplements, calcium, and vitamin D to strengthen bones.  You can use over-the-counter lubricants or moisturizers for vaginal dryness.  Group therapy is sometimes very helpful.  Acupuncture may be helpful in some cases. Contact a health care provider if:  You are not sure you are in menopause.  You are having menopausal symptoms and need advice and treatment.  You are still having menstrual periods after age 34 years.  You have pain with intercourse.  Menopause is complete (no menstrual period for 12 months) and you develop vaginal bleeding.  You need a referral to a specialist (gynecologist, psychiatrist, or  psychologist) for treatment. Get help right away if:  You have severe depression.  You have excessive vaginal bleeding.  You fell and think you have a broken bone.  You have pain when you urinate.  You develop leg or chest pain.  You have a fast pounding heart beat (palpitations).  You have severe headaches.  You develop vision problems.  You feel a lump in your breast.  You have abdominal pain or severe indigestion. This information is not intended to replace advice given to you by your health care provider. Make sure you discuss any questions you have with your health care provider. Document Released: 08/04/2003 Document Revised: 10/20/2015 Document Reviewed: 12/11/2012 Elsevier Interactive Patient Education  2017 [employer].

## 2017-10-25 ENCOUNTER — Encounter: Payer: Self-pay | Admitting: Physician Assistant

## 2017-10-25 ENCOUNTER — Ambulatory Visit: Payer: BLUE CROSS/BLUE SHIELD | Admitting: Physician Assistant

## 2017-10-25 VITALS — BP 110/72 | HR 110 | Temp 98.7°F | Resp 16 | Wt 111.0 lb

## 2017-10-25 DIAGNOSIS — R232 Flushing: Secondary | ICD-10-CM

## 2017-10-25 DIAGNOSIS — F419 Anxiety disorder, unspecified: Secondary | ICD-10-CM

## 2017-10-25 DIAGNOSIS — G43109 Migraine with aura, not intractable, without status migrainosus: Secondary | ICD-10-CM

## 2017-10-25 DIAGNOSIS — J301 Allergic rhinitis due to pollen: Secondary | ICD-10-CM

## 2017-10-25 MED ORDER — AMITRIPTYLINE HCL 50 MG PO TABS: ORAL_TABLET | ORAL | 0 refills | Status: DC

## 2017-10-25 MED ORDER — TOPIRAMATE 25 MG PO TABS: 25.0000 mg | ORAL_TABLET | Freq: Two times a day (BID) | ORAL | 0 refills | Status: DC

## 2017-10-25 MED ORDER — BUSPIRONE HCL 10 MG PO TABS: 10.0000 mg | ORAL_TABLET | Freq: Three times a day (TID) | ORAL | 0 refills | Status: DC

## 2017-10-25 MED ORDER — VENLAFAXINE HCL ER 75 MG PO CP24: 75.0000 mg | ORAL_CAPSULE | Freq: Every day | ORAL | 0 refills | Status: DC

## 2017-10-25 NOTE — Progress Notes (Signed)
Patient: Jacqueline Wong Female    DOB: 01/10/1971   47 y.o.   MRN: [id number] Visit Date: 10/25/2017  Today's Provider: Mar Daring, PA-C   Chief Complaint  Patient presents with  . Follow-up  . Hot Flashes  . Migraine  . URI   Subjective:    HPI Patient comes in today for a follow up. She was seen in the office 4 weeks ago.   Hot Flashes Patient was started on venlafaxine 37.5mg  daily. She reports that she can not tell that the medication has helped much.    Migraine Patient was advised to increase amitriptyline to 50mg  and could take 100mg  if needed. She was also advised to increase Sumatriptan to 100mg  for breakthrough symptoms. She reports that her migraines are becoming more infrequent, but now she thinks her headaches could be related to allergies.   URI Patient reports that she has had a non productive cough for the last 5 days. She has used Flonase with some relief.   No Known Allergies   Current Outpatient Medications:  .  amitriptyline (ELAVIL) 50 MG tablet, Take 1 tablet (50 mg total) by mouth at bedtime. May increase to 2 tabs PO qhs if needed, Disp: 60 tablet, Rfl: 0 .  cyclobenzaprine (FLEXERIL) 5 MG tablet, Take 1 tablet (5 mg total) by mouth at bedtime., Disp: 30 tablet, Rfl: 5 .  ondansetron (ZOFRAN) 4 MG tablet, Take 1 tablet (4 mg total) by mouth every 8 (eight) hours as needed for nausea or vomiting., Disp: 20 tablet, Rfl: 5 .  SUMAtriptan (IMITREX) 100 MG tablet, Take 1 tab PO at migraine onset. May repeat in 2 hours if headache persists or recurs. Max 2 tab daily, Disp: 10 tablet, Rfl: 5 .  venlafaxine XR (EFFEXOR-XR) 37.5 MG 24 hr capsule, Take 1 capsule (37.5 mg total) by mouth daily with breakfast., Disp: 90 capsule, Rfl: 1  Review of Systems  Constitutional: Positive for fatigue.  HENT: Positive for postnasal drip and voice change.   Respiratory: Positive for cough and wheezing.   Cardiovascular: Negative for chest pain,  palpitations and leg swelling.  Endocrine: Negative for cold intolerance, heat intolerance, polydipsia, polyphagia and polyuria.  Allergic/Immunologic: Positive for environmental allergies.  Neurological: Positive for headaches. Negative for dizziness.    Social History   Tobacco Use  . Smoking status: Never Smoker  . Smokeless tobacco: Never Used  Substance Use Topics  . Alcohol use: Yes    Alcohol/week: 1.8 oz    Types: 2 Glasses of wine, 1 Shots of liquor per week   Objective:   BP 110/72 (BP Location: Left Arm, Patient Position: Sitting, Cuff Size: Normal)   Pulse (!) 110   Temp 98.7 F (37.1 C)   Resp 16   Wt 111 lb (50.3 kg)   LMP 03/13/2016   SpO2 99%   BMI 24.02 kg/m  Vitals:   10/25/17 0833  BP: 110/72  Pulse: (!) 110  Resp: 16  Temp: 98.7 F (37.1 C)  SpO2: 99%  Weight: 111 lb (50.3 kg)     Physical Exam  Constitutional: She appears well-developed and well-nourished. No distress.  HENT:  Head: Normocephalic and atraumatic.  Right Ear: Hearing, tympanic membrane and external ear normal.  Left Ear: Hearing, tympanic membrane and external ear normal.  Nose: Nose normal.  Mouth/Throat: Oropharynx is clear and moist. No oropharyngeal exudate.  Eyes: Pupils are equal, round, and reactive to light. Conjunctivae and EOM are normal. Right  eye exhibits no discharge. Left eye exhibits no discharge.  Neck: Normal range of motion. Neck supple. No JVD present. No tracheal deviation present. No Brudzinski's sign and no Kernig's sign noted. No thyromegaly present.  Cardiovascular: Normal rate, regular rhythm and normal heart sounds. Exam reveals no gallop and no friction rub.  No murmur heard. Pulmonary/Chest: Effort normal and breath sounds normal. No stridor. No respiratory distress. She has no wheezes. She has no rales. She exhibits no tenderness.  Lymphadenopathy:    She has no cervical adenopathy.  Skin: Skin is warm and dry. She is not diaphoretic.  Vitals  reviewed.       Assessment & Plan:     1. Migraine with aura and without status migrainosus, not intractable Change elavil to topamax as below. Elavil tapering dose then start topamax for preventative. Discussed if migraines continue to not respond may benefit from Neuro consult. She declines currently. Continue Imitrex as this is working for breakthrough migraines. Recheck in 4 weeks.  - topiramate (TOPAMAX) 25 MG tablet; Take 1 tablet (25 mg total) by mouth 2 (two) times daily.  Dispense: 60 tablet; Refill: 0 - amitriptyline (ELAVIL) 50 MG tablet; Take 1 tab (50mg ) PO q HS x 1 week, then 0.5 tab (25mg ) PO q HS x 1 week, then discontinue  Dispense: 10 tablet; Refill: 0  2. Anxiety Worsening. Increase effexor as below and add buspar. I will see her back in 4 weeks to recheck.  - venlafaxine XR (EFFEXOR-XR) 75 MG 24 hr capsule; Take 1 capsule (75 mg total) by mouth daily with breakfast. May increase to two daily (150mg ) if needed after 2 weeks  Dispense: 60 capsule; Refill: 0 - busPIRone (BUSPAR) 10 MG tablet; Take 1 tablet (10 mg total) by mouth 3 (three) times daily.  Dispense: 90 tablet; Refill: 0  3. Hot flashes Stable.  - venlafaxine XR (EFFEXOR-XR) 75 MG 24 hr capsule; Take 1 capsule (75 mg total) by mouth daily with breakfast. May increase to two daily (150mg ) if needed after 2 weeks  Dispense: 60 capsule; Refill: 0  4. Seasonal allergic rhinitis due to pollen Advised to use flonase at bedtime as this is when her cough is worst.        Mar Daring, PA-C  [place]

## 2017-10-25 NOTE — Patient Instructions (Addendum)
Amitrptyline 50mg  (1 tab) x 1 week then decrease to 25mg  (1/2 tab) x 1 week then stop then start Topamax.

## 2017-11-22 ENCOUNTER — Encounter: Payer: Self-pay | Admitting: Physician Assistant

## 2017-11-22 ENCOUNTER — Ambulatory Visit: Payer: BLUE CROSS/BLUE SHIELD | Admitting: Physician Assistant

## 2017-11-22 VITALS — BP 110/78 | HR 105 | Temp 98.3°F | Resp 16 | Ht <= 58 in | Wt 110.0 lb

## 2017-11-22 DIAGNOSIS — F411 Generalized anxiety disorder: Secondary | ICD-10-CM

## 2017-11-22 DIAGNOSIS — G43109 Migraine with aura, not intractable, without status migrainosus: Secondary | ICD-10-CM

## 2017-11-22 DIAGNOSIS — F321 Major depressive disorder, single episode, moderate: Secondary | ICD-10-CM

## 2017-11-22 MED ORDER — ALPRAZOLAM 0.5 MG PO TABS: 0.5000 mg | ORAL_TABLET | Freq: Two times a day (BID) | ORAL | 5 refills | Status: DC | PRN

## 2017-11-22 MED ORDER — CYCLOBENZAPRINE HCL 10 MG PO TABS: 10.0000 mg | ORAL_TABLET | Freq: Three times a day (TID) | ORAL | 1 refills | Status: DC | PRN

## 2017-11-22 NOTE — Progress Notes (Signed)
Patient: Jacqueline Wong Female    DOB: 12/31/1970   47 y.o.   MRN: [id number] Visit Date: 11/22/2017  Today's Provider: Mar Daring, PA-C   Chief Complaint  Patient presents with  . Follow-up    4 weeks Anxiety and Migraines.   Subjective:    HPI  Patient comes in today for a follow up. She was seen in the office 4 weeks ago.  Migraine: Patient medication was change.Elavil to topamax. Elavil tapering dose then start topamax for preventative. Discussed if migraines continue to not respond may benefit from Neuro consult. Continue Imitrex as this is working for breakthrough migraines. Per patient her migraines are still the same. She is not seeing any improvement.She is willing to go the Neurologist for consult. She reports she is not sleeping well. States that she is now having this weird smell like a "burning" smell.  Anxiety: Patient was seen 4 weeks ago for worsening anxiety her effexor was increase and buspar was added.Patient reports she is having difficult sleeping. Reports she is only sleeping 4 hours.States she feels almost "manic" it feels more out of control. She is not sure if not able to sleep is coming from the Topamax or Buspar.  GAD 7 : Generalized Anxiety Score 11/22/2017  Nervous, Anxious, on Edge 3  Control/stop worrying 2  Worry too much - different things 2  Trouble relaxing 3  Restless 2  Easily annoyed or irritable 2  Afraid - awful might happen 2  Total GAD 7 Score 16  Anxiety Difficulty Somewhat difficult     Depression screen [facility] 2/9 11/22/2017 08/30/2017  Decreased Interest 2 0  Down, Depressed, Hopeless 2 0  PHQ - 2 Score 4 0  Altered sleeping 3 -  Tired, decreased energy 1 -  Change in appetite 3 -  Feeling bad or failure about yourself  0 -  Trouble concentrating 2 -  Moving slowly or fidgety/restless 2 -  Suicidal thoughts 0 -  PHQ-9 Score 15 -  Difficult doing work/chores Somewhat difficult -      No Known  Allergies   Current Outpatient Medications:  .  busPIRone (BUSPAR) 10 MG tablet, Take 1 tablet (10 mg total) by mouth 3 (three) times daily., Disp: 90 tablet, Rfl: 0 .  cyclobenzaprine (FLEXERIL) 5 MG tablet, Take 1 tablet (5 mg total) by mouth at bedtime., Disp: 30 tablet, Rfl: 5 .  ondansetron (ZOFRAN) 4 MG tablet, Take 1 tablet (4 mg total) by mouth every 8 (eight) hours as needed for nausea or vomiting., Disp: 20 tablet, Rfl: 5 .  SUMAtriptan (IMITREX) 100 MG tablet, Take 1 tab PO at migraine onset. May repeat in 2 hours if headache persists or recurs. Max 2 tab daily, Disp: 10 tablet, Rfl: 5 .  topiramate (TOPAMAX) 25 MG tablet, Take 1 tablet (25 mg total) by mouth 2 (two) times daily., Disp: 60 tablet, Rfl: 0 .  venlafaxine XR (EFFEXOR-XR) 75 MG 24 hr capsule, Take 1 capsule (75 mg total) by mouth daily with breakfast. May increase to two daily (150mg ) if needed after 2 weeks, Disp: 60 capsule, Rfl: 0 .  amitriptyline (ELAVIL) 50 MG tablet, Take 1 tab (50mg ) PO q HS x 1 week, then 0.5 tab (25mg ) PO q HS x 1 week, then discontinue (Patient not taking: Reported on 11/22/2017), Disp: 10 tablet, Rfl: 0  Review of Systems  Constitutional: Positive for fatigue.  Eyes: Positive for photophobia.       "Pressure on her  eyes"  Respiratory: Negative.   Cardiovascular: Negative for chest pain, palpitations and leg swelling.  Neurological: Positive for headaches.  Psychiatric/Behavioral: Positive for decreased concentration and sleep disturbance. The patient is nervous/anxious.     Social History   Tobacco Use  . Smoking status: Never Smoker  . Smokeless tobacco: Never Used  Substance Use Topics  . Alcohol use: Yes    Alcohol/week: 1.8 oz    Types: 2 Glasses of wine, 1 Shots of liquor per week   Objective:   BP 110/78 (BP Location: Left Arm, Patient Position: Sitting, Cuff Size: Normal)   Pulse (!) 105   Temp 98.3 F (36.8 C) (Oral)   Resp 16   Ht 4\' 9"  (1.448 m)   Wt 110 lb (49.9 kg)    LMP 03/13/2016   SpO2 99%   BMI 23.80 kg/m  Vitals:   11/22/17 0855  BP: 110/78  Pulse: (!) 105  Resp: 16  Temp: 98.3 F (36.8 C)  TempSrc: Oral  SpO2: 99%  Weight: 110 lb (49.9 kg)  Height: 4\' 9"  (1.448 m)     Physical Exam  Constitutional: She is oriented to person, place, and time. She appears well-developed and well-nourished. No distress.  HENT:  Head: Normocephalic and atraumatic.  Mouth/Throat: Oropharynx is clear and moist.  Eyes: Pupils are equal, round, and reactive to light. EOM are normal.  Neck: Normal range of motion. Neck supple. No JVD present. No tracheal deviation present. No thyromegaly present.  Cardiovascular: Normal rate, regular rhythm and normal heart sounds. Exam reveals no gallop and no friction rub.  No murmur heard. Pulmonary/Chest: Effort normal and breath sounds normal. No respiratory distress. She has no wheezes. She has no rales.  Lymphadenopathy:    She has no cervical adenopathy.  Neurological: She is alert and oriented to person, place, and time. No cranial nerve deficit or sensory deficit.  Skin: She is not diaphoretic.  Vitals reviewed.      Assessment & Plan:     1. Migraine with aura and without status migrainosus, not intractable Will continue flexeril for tension noted through cervical spine musculature (suspected tension headache causing migraines). Tried Elavil and Topamax and failed. Continue Imitrex prn for migraines. Referral to Neuro as below for further evaluation.  - cyclobenzaprine (FLEXERIL) 10 MG tablet; Take 1 tablet (10 mg total) by mouth 3 (three) times daily as needed for muscle spasms.  Dispense: 90 tablet; Refill: 1 - Ambulatory referral to Neurology  2. GAD (generalized anxiety disorder) Worsening. Will give short dose of xanax as below. Referral to psychiatry placed for anxiety as below.  - ALPRAZolam (XANAX) 0.5 MG tablet; Take 1 tablet (0.5 mg total) by mouth 2 (two) times daily as needed for anxiety.   Dispense: 60 tablet; Refill: 5 - Ambulatory referral to Psychiatry  3. Depression, major, single episode, moderate (HCC) Mild. Patient declines medications. Referral to psych made for patient.  - Ambulatory referral to Psychiatry       Mar Daring, PA-C  [facility]

## 2017-11-27 ENCOUNTER — Other Ambulatory Visit: Payer: Self-pay | Admitting: Physician Assistant

## 2017-11-27 DIAGNOSIS — R232 Flushing: Secondary | ICD-10-CM

## 2017-11-27 DIAGNOSIS — G43109 Migraine with aura, not intractable, without status migrainosus: Secondary | ICD-10-CM

## 2017-11-27 DIAGNOSIS — F419 Anxiety disorder, unspecified: Secondary | ICD-10-CM

## 2017-11-29 ENCOUNTER — Encounter: Payer: Self-pay | Admitting: Physician Assistant

## 2018-01-03 ENCOUNTER — Ambulatory Visit: Payer: Self-pay | Admitting: Physician Assistant

## 2018-01-05 DIAGNOSIS — G43709 Chronic migraine without aura, not intractable, without status migrainosus: Secondary | ICD-10-CM | POA: Insufficient documentation

## 2018-01-13 ENCOUNTER — Ambulatory Visit: Payer: BLUE CROSS/BLUE SHIELD | Admitting: Physician Assistant

## 2018-01-13 ENCOUNTER — Encounter: Payer: Self-pay | Admitting: Physician Assistant

## 2018-01-13 VITALS — BP 120/80 | HR 88 | Temp 98.7°F | Resp 16 | Wt 112.0 lb

## 2018-01-13 DIAGNOSIS — F411 Generalized anxiety disorder: Secondary | ICD-10-CM

## 2018-01-13 DIAGNOSIS — H40053 Ocular hypertension, bilateral: Secondary | ICD-10-CM | POA: Insufficient documentation

## 2018-01-13 DIAGNOSIS — M26621 Arthralgia of right temporomandibular joint: Secondary | ICD-10-CM

## 2018-01-13 DIAGNOSIS — G43709 Chronic migraine without aura, not intractable, without status migrainosus: Secondary | ICD-10-CM | POA: Diagnosis not present

## 2018-01-13 DIAGNOSIS — F458 Other somatoform disorders: Secondary | ICD-10-CM | POA: Insufficient documentation

## 2018-01-13 NOTE — Progress Notes (Signed)
Patient: Jacqueline Wong Female    DOB: May 12, 1971   47 y.o.   MRN: [id number] Visit Date: 01/13/2018  Today's Provider: Mar Daring, PA-C   Chief Complaint  Patient presents with  . Anxiety  . Depression  . Migraine   Subjective:    HPI  Follow up for Migraine  The patient was last seen for this 2 months ago. Changes made at last visit include continue Imitrex prn, start Flexeril and referred to neurology.  She reports excellent compliance with treatment. She feels that condition is Improved. Patient has seen neuro and has been started on Nortriptyline. Patient reports taking 10 mg at night. She also had a medrol dose pak to break headache cycle. She is not having side effects.  Reports doing very well. Has only had 1 migraine since completing the steroid taper. Has had some minor headaches but none severe.   ------------------------------------------------------------------------------------  Follow up for Anxiety/depression   The patient was last seen for this 2 months ago. Changes made at last visit include start short dose of xanax and referred to psychiatry. Patient declined medications for depression.  She reports excellent compliance with treatment. She feels that condition is Improved. Patient reports having take Xanax 2 tablets about 4 days a week.  She is not having side effects.   ------------------------------------------------------------------------------------  Depression screen [facility] 2/9 01/13/2018 11/22/2017 08/30/2017  Decreased Interest 1 2 0  Down, Depressed, Hopeless 0 2 0  PHQ - 2 Score 1 4 0  Altered sleeping 2 3 -  Tired, decreased energy 0 1 -  Change in appetite 0 3 -  Feeling bad or failure about yourself  0 0 -  Trouble concentrating 1 2 -  Moving slowly or fidgety/restless 0 2 -  Suicidal thoughts 0 0 -  PHQ-9 Score 4 15 -  Difficult doing work/chores Not difficult at all Somewhat difficult -   GAD 7 : Generalized  Anxiety Score 01/13/2018 11/22/2017  Nervous, Anxious, on Edge 1 3  Control/stop worrying 0 2  Worry too much - different things 0 2  Trouble relaxing 3 3  Restless 0 2  Easily annoyed or irritable 1 2  Afraid - awful might happen 0 2  Total GAD 7 Score 5 16  Anxiety Difficulty Not difficult at all Somewhat difficult       No Known Allergies   Current Outpatient Medications:  .  ALPRAZolam (XANAX) 0.5 MG tablet, Take 1 tablet (0.5 mg total) by mouth 2 (two) times daily as needed for anxiety., Disp: 60 tablet, Rfl: 5 .  cyclobenzaprine (FLEXERIL) 10 MG tablet, Take 1 tablet (10 mg total) by mouth 3 (three) times daily as needed for muscle spasms., Disp: 90 tablet, Rfl: 1 .  nortriptyline (PAMELOR) 10 MG capsule, Start Nortriptyline (Pamelor) 10 mg nightly for one week, then increase to 20 mg nightly, Disp: , Rfl:  .  ondansetron (ZOFRAN) 4 MG tablet, Take 1 tablet (4 mg total) by mouth every 8 (eight) hours as needed for nausea or vomiting., Disp: 20 tablet, Rfl: 5 .  SUMAtriptan (IMITREX) 100 MG tablet, Take 1 tab PO at migraine onset. May repeat in 2 hours if headache persists or recurs. Max 2 tab daily, Disp: 10 tablet, Rfl: 5 .  venlafaxine XR (EFFEXOR-XR) 75 MG 24 hr capsule, TAKE ONE CAPSULE BY MOUTH DAILY WITH BREAKFAST. MAY INCREASE TO 2 CAPSULES DAILY IF NEEDED AFTER 2 WEEKS, Disp: 60 capsule, Rfl: 5  Review of  Systems  Constitutional: Positive for fatigue.  HENT:       Clenches and grinds her teeth at night; now having right side jaw pain  Respiratory: Negative.   Cardiovascular: Negative.   Neurological: Positive for headaches.  Psychiatric/Behavioral: Positive for agitation and sleep disturbance. The patient is nervous/anxious.     Social History   Tobacco Use  . Smoking status: Never Smoker  . Smokeless tobacco: Never Used  Substance Use Topics  . Alcohol use: Yes    Alcohol/week: 3.0 standard drinks    Types: 2 Glasses of wine, 1 Shots of liquor per week    Objective:   BP 120/80 (BP Location: Left Arm, Patient Position: Sitting, Cuff Size: Normal)   Pulse 88   Temp 98.7 F (37.1 C) (Oral)   Resp 16   Wt 112 lb (50.8 kg)   LMP 03/13/2016   SpO2 99%   BMI 24.24 kg/m  Vitals:   01/13/18 0956  BP: 120/80  Pulse: 88  Resp: 16  Temp: 98.7 F (37.1 C)  TempSrc: Oral  SpO2: 99%  Weight: 112 lb (50.8 kg)     Physical Exam  Constitutional: She appears well-developed and well-nourished. No distress.  Neck: Normal range of motion. Neck supple.  Cardiovascular: Normal rate, regular rhythm and normal heart sounds. Exam reveals no gallop and no friction rub.  No murmur heard. Pulmonary/Chest: Effort normal and breath sounds normal. No respiratory distress. She has no wheezes. She has no rales.  Skin: She is not diaphoretic.  Vitals reviewed.       Assessment & Plan:     1. Chronic migraine without aura without status migrainosus, not intractable Doing much better after steroid taper and adding nortriptyline. Has not increased to 20mg , only on 10mg . Has only had to use Imitrex once since. Will be seeing Neuro on 01/22/18.   2. GAD (generalized anxiety disorder) Doing well with nortriptyline and xanax BID.   3. Increased intraocular pressure, bilateral Advised to call ophthalmologist.   4. Teeth grinding Has tried multiple night guards. Patient advised to establish with local dentist.   5. Arthralgia of right temporomandibular joint Secondary to teeth grinding suspected. See above medical treatment plan.       Mar Daring, PA-C  [facility]

## 2018-02-13 ENCOUNTER — Encounter: Payer: Self-pay | Admitting: Physician Assistant

## 2018-02-13 ENCOUNTER — Ambulatory Visit (INDEPENDENT_AMBULATORY_CARE_PROVIDER_SITE_OTHER): Payer: BLUE CROSS/BLUE SHIELD | Admitting: Physician Assistant

## 2018-02-13 VITALS — BP 100/70 | HR 97 | Temp 98.5°F | Resp 16 | Ht <= 58 in | Wt 112.4 lb

## 2018-02-13 DIAGNOSIS — J305 Allergic rhinitis due to food: Secondary | ICD-10-CM

## 2018-02-13 DIAGNOSIS — Z1239 Encounter for other screening for malignant neoplasm of breast: Secondary | ICD-10-CM

## 2018-02-13 DIAGNOSIS — Z0000 Encounter for general adult medical examination without abnormal findings: Secondary | ICD-10-CM

## 2018-02-13 DIAGNOSIS — Z1231 Encounter for screening mammogram for malignant neoplasm of breast: Secondary | ICD-10-CM

## 2018-02-13 DIAGNOSIS — G43709 Chronic migraine without aura, not intractable, without status migrainosus: Secondary | ICD-10-CM | POA: Diagnosis not present

## 2018-02-13 DIAGNOSIS — Z114 Encounter for screening for human immunodeficiency virus [HIV]: Secondary | ICD-10-CM | POA: Diagnosis not present

## 2018-02-13 NOTE — Progress Notes (Signed)
Patient: Jacqueline Wong, Female    DOB: [birth year], 47 y.o.   MRN: [id number] Visit Date: 02/13/2018  Today's Provider: Mar Daring, PA-C   Chief Complaint  Patient presents with  . Annual Exam   Subjective:    Annual physical exam Jacqueline Wong is a 47 y.o. female who presents today for health maintenance and complete physical. She feels well. She reports exercising. She reports she is sleeping well. ----------------------------------------------------------------- Patient Declined Influenza Vaccine and Mammogram screening.  Review of Systems  Constitutional: Negative.   HENT: Negative.   Eyes: Positive for photophobia and pain.  Respiratory: Positive for cough.   Cardiovascular: Negative.   Gastrointestinal: Negative.   Endocrine: Negative.   Genitourinary: Negative.   Musculoskeletal: Positive for back pain, neck pain and neck stiffness.  Skin: Negative.   Allergic/Immunologic: Positive for environmental allergies.  Neurological: Positive for speech difficulty ("with Migraine), light-headedness and headaches.  Hematological: Negative.   Psychiatric/Behavioral: Negative.     Social History      She  reports that she has never smoked. She has never used smokeless tobacco. She reports that she drinks about 3.0 standard drinks of alcohol per week. She reports that she does not use drugs.       Social History   Socioeconomic History  . Marital status: Married    Spouse name: Not on file  . Number of children: Not on file  . Years of education: Not on file  . Highest education level: Not on file  Occupational History  . Not on file  Social Needs  . Financial resource strain: Not on file  . Food insecurity:    Worry: Not on file    Inability: Not on file  . Transportation needs:    Medical: Not on file    Non-medical: Not on file  Tobacco Use  . Smoking status: Never Smoker  . Smokeless tobacco: Never Used  Substance and Sexual Activity    . Alcohol use: Yes    Alcohol/week: 3.0 standard drinks    Types: 2 Glasses of wine, 1 Shots of liquor per week  . Drug use: No  . Sexual activity: Yes    Birth control/protection: Surgical  Lifestyle  . Physical activity:    Days per week: Not on file    Minutes per session: Not on file  . Stress: Not on file  Relationships  . Social connections:    Talks on phone: Not on file    Gets together: Not on file    Attends religious service: Not on file    Active member of club or organization: Not on file    Attends meetings of clubs or organizations: Not on file    Relationship status: Not on file  Other Topics Concern  . Not on file  Social History Narrative  . Not on file    Past Medical History:  Diagnosis Date  . Allergy   . Anxiety   . Arthritis   . Heart murmur   . Kidney stone   . Kidney stones 1987  . Migraine   . Scoliosis   . Teeth grinding      Patient Active Problem List   Diagnosis Date Noted  . Increased intraocular pressure, bilateral 01/13/2018  . GAD (generalized anxiety disorder) 01/13/2018  . Teeth grinding 01/13/2018  . Arthralgia of right temporomandibular joint 01/13/2018  . Chronic migraine without aura without status migrainosus, not intractable 01/05/2018  . Kidney stones  05/28/1985    Past Surgical History:  Procedure Laterality Date  . CESAREAN SECTION     x 2  . TUBAL LIGATION      Family History        Family Status  Relation Name Status  . Mother  Alive       Prolasped uterus   . Father  Alive  . Sister  Alive       Bladder prolapse  . Daughter  (Not Specified)  . MGM  (Not Specified)  . PGM  (Not Specified)  . PGF  (Not Specified)  . Other siblings (Not Specified)  . Neg Hx  (Not Specified)        Her family history includes Cancer in her maternal grandmother, other, and paternal grandfather; Heart disease in her paternal grandmother; Kidney Stones in her daughter; Migraines in her daughter; Osteoporosis in her  mother; Thyroid cancer in her sister. There is no history of Prostate cancer, Kidney disease, or Bladder Cancer.      No Known Allergies   Current Outpatient Medications:  .  ALPRAZolam (XANAX) 0.5 MG tablet, Take 1 tablet (0.5 mg total) by mouth 2 (two) times daily as needed for anxiety., Disp: 60 tablet, Rfl: 5 .  cyclobenzaprine (FLEXERIL) 10 MG tablet, Take 1 tablet (10 mg total) by mouth 3 (three) times daily as needed for muscle spasms., Disp: 90 tablet, Rfl: 1 .  nortriptyline (PAMELOR) 10 MG capsule, Start Nortriptyline (Pamelor) 10 mg nightly for one week, then increase to 20 mg nightly, Disp: , Rfl:  .  ondansetron (ZOFRAN) 4 MG tablet, Take 1 tablet (4 mg total) by mouth every 8 (eight) hours as needed for nausea or vomiting., Disp: 20 tablet, Rfl: 5 .  SUMAtriptan (IMITREX) 100 MG tablet, Take 1 tab PO at migraine onset. May repeat in 2 hours if headache persists or recurs. Max 2 tab daily, Disp: 10 tablet, Rfl: 5 .  venlafaxine XR (EFFEXOR-XR) 75 MG 24 hr capsule, TAKE ONE CAPSULE BY MOUTH DAILY WITH BREAKFAST. MAY INCREASE TO 2 CAPSULES DAILY IF NEEDED AFTER 2 WEEKS, Disp: 60 capsule, Rfl: 5   Patient Care Team: Mar Daring, PA-C as PCP - General (Family Medicine)      Objective:   Vitals: BP 100/70 (BP Location: Left Arm, Patient Position: Sitting, Cuff Size: Normal)   Pulse 97   Temp 98.5 F (36.9 C) (Oral)   Resp 16   Ht 4\' 9"  (1.448 m)   Wt 112 lb 6.4 oz (51 kg)   LMP 03/13/2016   SpO2 98%   BMI 24.32 kg/m    Vitals:   02/13/18 0911  BP: 100/70  Pulse: 97  Resp: 16  Temp: 98.5 F (36.9 C)  TempSrc: Oral  SpO2: 98%  Weight: 112 lb 6.4 oz (51 kg)  Height: 4\' 9"  (1.448 m)     Physical Exam  Constitutional: She is oriented to person, place, and time. She appears well-developed and well-nourished. No distress.  HENT:  Head: Normocephalic and atraumatic.  Right Ear: Hearing, tympanic membrane, external ear and ear canal normal.  Left Ear:  Hearing, tympanic membrane, external ear and ear canal normal.  Nose: Nose normal.  Mouth/Throat: Uvula is midline, oropharynx is clear and moist and mucous membranes are normal. No oropharyngeal exudate.  Eyes: Pupils are equal, round, and reactive to light. Conjunctivae and EOM are normal. Right eye exhibits no discharge. Left eye exhibits no discharge. No scleral icterus.  Neck: Normal range of motion.  Neck supple. No JVD present. Carotid bruit is not present. No tracheal deviation present. No thyromegaly present.  Cardiovascular: Normal rate, regular rhythm, normal heart sounds and intact distal pulses. Exam reveals no gallop and no friction rub.  No murmur heard. Pulmonary/Chest: Effort normal and breath sounds normal. No respiratory distress. She has no wheezes. She has no rales. She exhibits no tenderness. Right breast exhibits no inverted nipple, no mass, no nipple discharge, no skin change and no tenderness. Left breast exhibits no inverted nipple, no mass, no nipple discharge, no skin change and no tenderness. No breast swelling, tenderness, discharge or bleeding. Breasts are symmetrical.  Abdominal: Soft. Bowel sounds are normal. She exhibits no distension and no mass. There is no tenderness. There is no rebound and no guarding.  Musculoskeletal: Normal range of motion. She exhibits no edema or tenderness.  Lymphadenopathy:    She has no cervical adenopathy.  Neurological: She is alert and oriented to person, place, and time.  Skin: Skin is warm and dry. No rash noted. She is not diaphoretic.  Psychiatric: She has a normal mood and affect. Her behavior is normal. Judgment and thought content normal.  Vitals reviewed.    Depression Screen PHQ 2/9 Scores 02/13/2018 01/13/2018 11/22/2017 08/30/2017  PHQ - 2 Score 1 1 4  0  PHQ- 9 Score - 4 15 -      Assessment & Plan:     Routine Health Maintenance and Physical Exam  Exercise Activities and Dietary recommendations Goals   None        There is no immunization history on file for this patient.  Health Maintenance  Topic Date Due  . HIV Screening  12/05/1983  . TETANUS/TDAP  12/05/1987  . INFLUENZA VACCINE  12/26/2017  . PAP SMEAR  03/16/2019     Discussed health benefits of physical activity, and encouraged her to engage in regular exercise appropriate for her age and condition.    1. Annual physical exam Normal physical exam today. Will check labs as below and f/u pending lab results. If labs are stable and WNL she will not need to have these rechecked for one year at her next annual physical exam. She is to call the office in the meantime if she has any acute issue, questions or concerns. - CBC with Differential/Platelet - Comprehensive metabolic panel - Hemoglobin A1c - Lipid panel - TSH  2. Breast cancer screening Declined mammogram. No family history. Breast exam normal today. Does perform self breast exams.   3. Chronic migraine without aura without status migrainosus, not intractable Followed by Neuro. Had MRI earlier this week. Results not available. Has f/u with Dr. Melrose Nakayama next week.   4. Screening for HIV without presence of risk factors Will check labs as below and f/u pending results. - HIV antibody (with reflex)  5. Allergic rhinitis due to food Lab ordered for Dr. Pryor Ochoa so she can have all labs drawn here. Will send results to him once received.  - Allergens (10) Foods  --------------------------------------------------------------------    Mar Daring, PA-C  [facility]

## 2018-02-13 NOTE — Patient Instructions (Signed)
Health Maintenance for Postmenopausal Women Menopause is a normal process in which your reproductive ability comes to an end. This process happens gradually over a span of months to years, usually between the ages of 22 and 9. Menopause is complete when you have missed 12 consecutive menstrual periods. It is important to talk with your health care provider about some of the most common conditions that affect postmenopausal women, such as heart disease, cancer, and bone loss (osteoporosis). Adopting a healthy lifestyle and getting preventive care can help to promote your health and wellness. Those actions can also lower your chances of developing some of these common conditions. What should I know about menopause? During menopause, you may experience a number of symptoms, such as:  Moderate-to-severe hot flashes.  Night sweats.  Decrease in sex drive.  Mood swings.  Headaches.  Tiredness.  Irritability.  Memory problems.  Insomnia.  Choosing to treat or not to treat menopausal changes is an individual decision that you make with your health care provider. What should I know about hormone replacement therapy and supplements? Hormone therapy products are effective for treating symptoms that are associated with menopause, such as hot flashes and night sweats. Hormone replacement carries certain risks, especially as you become older. If you are thinking about using estrogen or estrogen with progestin treatments, discuss the benefits and risks with your health care provider. What should I know about heart disease and stroke? Heart disease, heart attack, and stroke become more likely as you age. This may be due, in part, to the hormonal changes that your body experiences during menopause. These can affect how your body processes dietary fats, triglycerides, and cholesterol. Heart attack and stroke are both medical emergencies. There are many things that you can do to help prevent heart disease  and stroke:  Have your blood pressure checked at least every 1-2 years. High blood pressure causes heart disease and increases the risk of stroke.  If you are 53-22 years old, ask your health care provider if you should take aspirin to prevent a heart attack or a stroke.  Do not use any tobacco products, including cigarettes, chewing tobacco, or electronic cigarettes. If you need help quitting, ask your health care provider.  It is important to eat a healthy diet and maintain a healthy weight. ? Be sure to include plenty of vegetables, fruits, low-fat dairy products, and lean protein. ? Avoid eating foods that are high in solid fats, added sugars, or salt (sodium).  Get regular exercise. This is one of the most important things that you can do for your health. ? Try to exercise for at least 150 minutes each week. The type of exercise that you do should increase your heart rate and make you sweat. This is known as moderate-intensity exercise. ? Try to do strengthening exercises at least twice each week. Do these in addition to the moderate-intensity exercise.  Know your numbers.Ask your health care provider to check your cholesterol and your blood glucose. Continue to have your blood tested as directed by your health care provider.  What should I know about cancer screening? There are several types of cancer. Take the following steps to reduce your risk and to catch any cancer development as early as possible. Breast Cancer  Practice breast self-awareness. ? This means understanding how your breasts normally appear and feel. ? It also means doing regular breast self-exams. Let your health care provider know about any changes, no matter how small.  If you are 40  or older, have a clinician do a breast exam (clinical breast exam or CBE) every year. Depending on your age, family history, and medical history, it may be recommended that you also have a yearly breast X-ray (mammogram).  If you  have a family history of breast cancer, talk with your health care provider about genetic screening.  If you are at high risk for breast cancer, talk with your health care provider about having an MRI and a mammogram every year.  Breast cancer (BRCA) gene test is recommended for women who have family members with BRCA-related cancers. Results of the assessment will determine the need for genetic counseling and BRCA1 and for BRCA2 testing. BRCA-related cancers include these types: ? Breast. This occurs in males or females. ? Ovarian. ? Tubal. This may also be called fallopian tube cancer. ? Cancer of the abdominal or pelvic lining (peritoneal cancer). ? Prostate. ? Pancreatic.  Cervical, Uterine, and Ovarian Cancer Your health care provider may recommend that you be screened regularly for cancer of the pelvic organs. These include your ovaries, uterus, and vagina. This screening involves a pelvic exam, which includes checking for microscopic changes to the surface of your cervix (Pap test).  For women ages 21-65, health care providers may recommend a pelvic exam and a Pap test every three years. For women ages 79-65, they may recommend the Pap test and pelvic exam, combined with testing for human papilloma virus (HPV), every five years. Some types of HPV increase your risk of cervical cancer. Testing for HPV may also be done on women of any age who have unclear Pap test results.  Other health care providers may not recommend any screening for nonpregnant women who are considered low risk for pelvic cancer and have no symptoms. Ask your health care provider if a screening pelvic exam is right for you.  If you have had past treatment for cervical cancer or a condition that could lead to cancer, you need Pap tests and screening for cancer for at least 20 years after your treatment. If Pap tests have been discontinued for you, your risk factors (such as having a new sexual partner) need to be  reassessed to determine if you should start having screenings again. Some women have medical problems that increase the chance of getting cervical cancer. In these cases, your health care provider may recommend that you have screening and Pap tests more often.  If you have a family history of uterine cancer or ovarian cancer, talk with your health care provider about genetic screening.  If you have vaginal bleeding after reaching menopause, tell your health care provider.  There are currently no reliable tests available to screen for ovarian cancer.  Lung Cancer Lung cancer screening is recommended for adults 69-62 years old who are at high risk for lung cancer because of a history of smoking. A yearly low-dose CT scan of the lungs is recommended if you:  Currently smoke.  Have a history of at least 30 pack-years of smoking and you currently smoke or have quit within the past 15 years. A pack-year is smoking an average of one pack of cigarettes per day for one year.  Yearly screening should:  Continue until it has been 15 years since you quit.  Stop if you develop a health problem that would prevent you from having lung cancer treatment.  Colorectal Cancer  This type of cancer can be detected and can often be prevented.  Routine colorectal cancer screening usually begins at  age 69 and continues through age 20.  If you have risk factors for colon cancer, your health care provider may recommend that you be screened at an earlier age.  If you have a family history of colorectal cancer, talk with your health care provider about genetic screening.  Your health care provider may also recommend using home test kits to check for hidden blood in your stool.  A small camera at the end of a tube can be used to examine your colon directly (sigmoidoscopy or colonoscopy). This is done to check for the earliest forms of colorectal cancer.  Direct examination of the colon should be repeated every  5-10 years until age 19. However, if early forms of precancerous polyps or small growths are found or if you have a family history or genetic risk for colorectal cancer, you may need to be screened more often.  Skin Cancer  Check your skin from head to toe regularly.  Monitor any moles. Be sure to tell your health care provider: ? About any new moles or changes in moles, especially if there is a change in a mole's shape or color. ? If you have a mole that is larger than the size of a pencil eraser.  If any of your family members has a history of skin cancer, especially at a young age, talk with your health care provider about genetic screening.  Always use sunscreen. Apply sunscreen liberally and repeatedly throughout the day.  Whenever you are outside, protect yourself by wearing long sleeves, pants, a wide-brimmed hat, and sunglasses.  What should I know about osteoporosis? Osteoporosis is a condition in which bone destruction happens more quickly than new bone creation. After menopause, you may be at an increased risk for osteoporosis. To help prevent osteoporosis or the bone fractures that can happen because of osteoporosis, the following is recommended:  If you are 19-73 years old, get at least 1,000 mg of calcium and at least 600 mg of vitamin D per day.  If you are older than age 4 but younger than age 65, get at least 1,200 mg of calcium and at least 600 mg of vitamin D per day.  If you are older than age 1, get at least 1,200 mg of calcium and at least 800 mg of vitamin D per day.  Smoking and excessive alcohol intake increase the risk of osteoporosis. Eat foods that are rich in calcium and vitamin D, and do weight-bearing exercises several times each week as directed by your health care provider. What should I know about how menopause affects my mental health? Depression may occur at any age, but it is more common as you become older. Common symptoms of depression  include:  Low or sad mood.  Changes in sleep patterns.  Changes in appetite or eating patterns.  Feeling an overall lack of motivation or enjoyment of activities that you previously enjoyed.  Frequent crying spells.  Talk with your health care provider if you think that you are experiencing depression. What should I know about immunizations? It is important that you get and maintain your immunizations. These include:  Tetanus, diphtheria, and pertussis (Tdap) booster vaccine.  Influenza every year before the flu season begins.  Pneumonia vaccine.  Shingles vaccine.  Your health care provider may also recommend other immunizations. This information is not intended to replace advice given to you by your health care provider. Make sure you discuss any questions you have with your health care provider. Document Released: 07/06/2005  Document Revised: 12/02/2015 Document Reviewed: 02/15/2015 Elsevier Interactive Patient Education  [employer].

## 2018-02-16 LAB — COMPREHENSIVE METABOLIC PANEL WITH GFR
ALT: 11 IU/L (ref 0–32)
AST: 13 IU/L (ref 0–40)
Albumin/Globulin Ratio: 1.5 (ref 1.2–2.2)
Albumin: 4.3 g/dL (ref 3.5–5.5)
Alkaline Phosphatase: 61 IU/L (ref 39–117)
BUN/Creatinine Ratio: 14 (ref 9–23)
BUN: 10 mg/dL (ref 6–24)
Bilirubin Total: <0.2 mg/dL (ref 0.0–1.2)
CO2: 25 mmol/L (ref 20–29)
Calcium: 9.6 mg/dL (ref 8.7–10.2)
Chloride: 102 mmol/L (ref 96–106)
Creatinine, Ser: 0.71 mg/dL (ref 0.57–1.00)
GFR calc Af Amer: 116 mL/min/1.73
GFR calc non Af Amer: 100 mL/min/1.73
Globulin, Total: 2.9 g/dL (ref 1.5–4.5)
Glucose: 86 mg/dL (ref 65–99)
Potassium: 4.4 mmol/L (ref 3.5–5.2)
Sodium: 142 mmol/L (ref 134–144)
Total Protein: 7.2 g/dL (ref 6.0–8.5)

## 2018-02-16 LAB — TSH: TSH: 2.07 u[IU]/mL (ref 0.450–4.500)

## 2018-02-16 LAB — CBC WITH DIFFERENTIAL/PLATELET
Basophils Absolute: 0 10*3/uL (ref 0.0–0.2)
Basos: 1 %
EOS (ABSOLUTE): 0.1 10*3/uL (ref 0.0–0.4)
EOS: 2 %
HEMATOCRIT: 34.8 % (ref 34.0–46.6)
HEMOGLOBIN: 11.5 g/dL (ref 11.1–15.9)
IMMATURE GRANULOCYTES: 0 %
Immature Grans (Abs): 0 10*3/uL (ref 0.0–0.1)
Lymphocytes Absolute: 1.8 10*3/uL (ref 0.7–3.1)
Lymphs: 34 %
MCH: 30.6 pg (ref 26.6–33.0)
MCHC: 33 g/dL (ref 31.5–35.7)
MCV: 93 fL (ref 79–97)
MONOCYTES: 8 %
MONOS ABS: 0.4 10*3/uL (ref 0.1–0.9)
NEUTROS PCT: 55 %
Neutrophils Absolute: 2.9 10*3/uL (ref 1.4–7.0)
Platelets: 235 10*3/uL (ref 150–450)
RBC: 3.76 x10E6/uL — AB (ref 3.77–5.28)
RDW: 12.3 % (ref 12.3–15.4)
WBC: 5.2 10*3/uL (ref 3.4–10.8)

## 2018-02-16 LAB — LIPID PANEL
CHOLESTEROL TOTAL: 203 mg/dL — AB (ref 100–199)
Chol/HDL Ratio: 4.1 ratio (ref 0.0–4.4)
HDL: 50 mg/dL (ref >39)
LDL Calculated: 132 mg/dL — ABNORMAL HIGH (ref 0–99)
TRIGLYCERIDES: 104 mg/dL (ref 0–149)
VLDL Cholesterol Cal: 21 mg/dL (ref 5–40)

## 2018-02-16 LAB — HEMOGLOBIN A1C
ESTIMATED AVERAGE GLUCOSE: 103 mg/dL
HEMOGLOBIN A1C: 5.2 % (ref 4.8–5.6)

## 2018-02-16 LAB — ALLERGENS(10) FOODS
Allergen Corn, IgE: <0.1 kU/L
Egg, Whole IgE: <0.1 kU/L
F081-IgE Cheese, Cheddar Type: <0.1 kU/L
Peanut IgE: <0.1 kU/L
Soybean IgE: <0.1 kU/L
Wheat IgE: <0.1 kU/L

## 2018-02-16 LAB — HIV ANTIBODY (ROUTINE TESTING W REFLEX): HIV Screen 4th Generation wRfx: NONREACTIVE

## 2018-02-17 ENCOUNTER — Telehealth: Payer: Self-pay

## 2018-02-17 NOTE — Telephone Encounter (Signed)
-----   Message from Mar Daring, [place] sent at 02/17/2018 12:53 PM EDT ----- Slight decrease in hemoglobin over the last year. May be due to decreased iron intake. Recommend to try ferrous sulfate or ferrous gluconate 325mg  daily. Would like to recheck after taking iron for 4-6 weeks. Kidney and liver function are normal. Sugar normal. Cholesterol borderline high. Limit fatty foods, foods with high cholesterol and red meats in diet. Thyroid normal. HIV screen negative. Allergen panel is negative for cheese, yeast, egg, wheat, milk, corn, peanut, soybean and malt.

## 2018-02-17 NOTE — Telephone Encounter (Signed)
Patient advised as below. Patient verbalizes understanding and is in agreement with treatment plan.  

## 2018-03-26 ENCOUNTER — Telehealth: Payer: Self-pay | Admitting: Physician Assistant

## 2018-03-26 DIAGNOSIS — Z91018 Allergy to other foods: Secondary | ICD-10-CM

## 2018-03-26 DIAGNOSIS — G43101 Migraine with aura, not intractable, with status migrainosus: Secondary | ICD-10-CM

## 2018-03-26 DIAGNOSIS — F411 Generalized anxiety disorder: Secondary | ICD-10-CM

## 2018-03-26 NOTE — Telephone Encounter (Signed)
Pt calling to check on status of the  nutritionist referral. Also wanting to know if the dosage of the cyclobenzaprine (FLEXERIL) 10 MG tablet can be increased,  Please advise.  Thanks, [employer]

## 2018-03-31 MED ORDER — METAXALONE 800 MG PO TABS: 800.0000 mg | ORAL_TABLET | Freq: Three times a day (TID) | ORAL | 0 refills | Status: DC

## 2018-03-31 NOTE — Telephone Encounter (Signed)
Patient advised as directed below. Per patient she is willing to try another muscle relaxer and she is also requesting if you can resubmit the psychiatrist referral please.

## 2018-03-31 NOTE — Telephone Encounter (Signed)
Referral placed and skelaxin sent in to [facility]

## 2018-03-31 NOTE — Telephone Encounter (Signed)
Cyclobenzaprine is at max dose. If it is not working we can try a different muscle relaxer.   Referral is pending.

## 2018-04-09 ENCOUNTER — Other Ambulatory Visit: Payer: Self-pay | Admitting: Physician Assistant

## 2018-04-09 DIAGNOSIS — G43109 Migraine with aura, not intractable, without status migrainosus: Secondary | ICD-10-CM

## 2018-04-22 ENCOUNTER — Emergency Department
Admission: EM | Admit: 2018-04-22 | Discharge: 2018-04-22 | Disposition: A | Discharge status: Home / Self Care | Payer: BLUE CROSS/BLUE SHIELD | Attending: Emergency Medicine | Admitting: Emergency Medicine

## 2018-04-22 ENCOUNTER — Encounter: Payer: Self-pay | Admitting: Emergency Medicine

## 2018-04-22 ENCOUNTER — Other Ambulatory Visit: Payer: Self-pay

## 2018-04-22 DIAGNOSIS — G43801 Other migraine, not intractable, with status migrainosus: Secondary | ICD-10-CM | POA: Insufficient documentation

## 2018-04-22 DIAGNOSIS — R112 Nausea with vomiting, unspecified: Secondary | ICD-10-CM | POA: Diagnosis not present

## 2018-04-22 DIAGNOSIS — R51 Headache: Secondary | ICD-10-CM | POA: Diagnosis present

## 2018-04-22 DIAGNOSIS — H53149 Visual discomfort, unspecified: Secondary | ICD-10-CM | POA: Insufficient documentation

## 2018-04-22 MED ORDER — DEXTROSE-NACL 5-0.45 % IV SOLN
Freq: Once | INTRAVENOUS | Status: AC
  Administered 2018-04-22: 13:00:00 via INTRAVENOUS

## 2018-04-22 MED ORDER — DEXAMETHASONE SODIUM PHOSPHATE 4 MG/ML IJ SOLN
4.0000 mg | Freq: Once | INTRAMUSCULAR | Status: AC
  Administered 2018-04-22: 4 mg via INTRAVENOUS
  Filled 2018-04-22: qty 1

## 2018-04-22 MED ORDER — METOCLOPRAMIDE HCL 5 MG/ML IJ SOLN
10.0000 mg | Freq: Once | INTRAMUSCULAR | Status: AC
  Administered 2018-04-22: 10 mg via INTRAVENOUS
  Filled 2018-04-22: qty 2

## 2018-04-22 MED ORDER — DIPHENHYDRAMINE HCL 25 MG PO CAPS: 50.0000 mg | ORAL_CAPSULE | Freq: Four times a day (QID) | ORAL | 0 refills | Status: AC | PRN

## 2018-04-22 MED ORDER — KETOROLAC TROMETHAMINE 15 MG/ML IJ SOLN
15.0000 mg | Freq: Once | INTRAMUSCULAR | Status: AC
  Administered 2018-04-22: 15 mg via INTRAVENOUS
  Filled 2018-04-22: qty 1

## 2018-04-22 MED ORDER — METOCLOPRAMIDE HCL 10 MG PO TABS: 10.0000 mg | ORAL_TABLET | Freq: Four times a day (QID) | ORAL | 0 refills | Status: DC | PRN

## 2018-04-22 MED ORDER — MAGNESIUM SULFATE IN D5W 1-5 GM/100ML-% IV SOLN
1.0000 g | INTRAVENOUS | Status: AC
  Administered 2018-04-22: 1 g via INTRAVENOUS
  Filled 2018-04-22: qty 100

## 2018-04-22 MED ORDER — DIPHENHYDRAMINE HCL 50 MG/ML IJ SOLN
50.0000 mg | Freq: Once | INTRAMUSCULAR | Status: AC
  Administered 2018-04-22: 50 mg via INTRAVENOUS
  Filled 2018-04-22: qty 1

## 2018-04-22 NOTE — ED Triage Notes (Signed)
Patient reports that she had an SVT block done on 11/18 last Monday and since then she has not been able to hold anything dowan and feels like she is going through withdrawal from her medications. Patient reports hot flashes and cold chills but unsure of fever. Also reports muscles spasms.

## 2018-04-22 NOTE — ED Notes (Signed)
Pt ambulatory to toilet to urinate.

## 2018-04-22 NOTE — ED Notes (Signed)
PO challenge successful.

## 2018-04-22 NOTE — ED Provider Notes (Signed)
[facility] Emergency Department Provider Note  ____________________________________________  Time seen: Approximately 1:09 PM  I have reviewed the triage vital signs and the nursing notes.   HISTORY  Chief Complaint Emesis    HPI Jacqueline Wong is a 47 y.o. female with a history of kidney stones, anxiety, chronic migraine who complains of bilateral headache, light sensitivity, nausea vomiting, dry heaves, hot flashes and cold chills, feels like her previous migraines.  She recently went to neurology about a week ago, had a sphenopalatine ganglion block, but reports having a lot of severe side effects since then that have persisted over the past week.  She was supposed to go back yesterday for a repeat block procedure but canceled it due to her adverse effects that she is experiencing.  No thunderclap headache, double vision, focal paresthesia or weakness.  No trauma or neck stiffness.  This feels entirely consistent with her usual migraines without any atypical features.      Past Medical History:  Diagnosis Date  . Allergy   . Anxiety   . Arthritis   . Heart murmur   . Kidney stone   . Kidney stones 1987  . Migraine   . Scoliosis   . Teeth grinding      Patient Active Problem List   Diagnosis Date Noted  . Increased intraocular pressure, bilateral 01/13/2018  . GAD (generalized anxiety disorder) 01/13/2018  . Teeth grinding 01/13/2018  . Arthralgia of right temporomandibular joint 01/13/2018  . Chronic migraine without aura without status migrainosus, not intractable 01/05/2018  . Kidney stones 05/28/1985     Past Surgical History:  Procedure Laterality Date  . CESAREAN SECTION     x 2  . TUBAL LIGATION       Prior to Admission medications   Medication Sig Start Date End Date Taking? Authorizing Provider  ALPRAZolam Duanne Moron) 0.5 MG tablet Take 1 tablet (0.5 mg total) by mouth 2 (two) times daily as needed for anxiety. 11/22/17    Mar Daring, PA-C  diphenhydrAMINE (BENADRYL) 25 mg capsule Take 2 capsules (50 mg total) by mouth every 6 (six) hours as needed. 04/22/18   Carrie Mew, MD  metaxalone (SKELAXIN) 800 MG tablet Take 1 tablet (800 mg total) by mouth 3 (three) times daily. 03/31/18   Mar Daring, PA-C  metoCLOPramide (REGLAN) 10 MG tablet Take 1 tablet (10 mg total) by mouth every 6 (six) hours as needed. 04/22/18   Carrie Mew, MD  nortriptyline (PAMELOR) 10 MG capsule Start Nortriptyline (Pamelor) 10 mg nightly for one week, then increase to 20 mg nightly 12/31/17   [provider]  ondansetron (ZOFRAN) 4 MG tablet Take 1 tablet (4 mg total) by mouth every 8 (eight) hours as needed for nausea or vomiting. 08/30/17   Mar Daring, PA-C  SUMAtriptan (IMITREX) 100 MG tablet TAKE 1 TABLET BY MOUTH AT ONSET OF MIGRAINE. MAY REPEAT IN 2 HOURS IF HEADACHE PERSISTS OR RECURS. MAX 2 TABLET DAILY 04/09/18   Mar Daring, PA-C  venlafaxine XR (EFFEXOR-XR) 75 MG 24 hr capsule TAKE ONE CAPSULE BY MOUTH DAILY WITH BREAKFAST. MAY INCREASE TO 2 CAPSULES DAILY IF NEEDED AFTER 2 WEEKS 11/27/17   Mar Daring, PA-C     Allergies Patient has no known allergies.   Family History  Problem Relation Age of Onset  . Osteoporosis Mother   . Thyroid cancer Sister   . Migraines Daughter   . Kidney Stones Daughter   . Cancer Maternal  Grandmother   . Heart disease Paternal Grandmother        heart attack  . Cancer Paternal Grandfather   . Cancer Other        Thyroid  . Prostate cancer Neg Hx   . Kidney disease Neg Hx   . Bladder Cancer Neg Hx     Social History Social History   Tobacco Use  . Smoking status: Never Smoker  . Smokeless tobacco: Never Used  Substance Use Topics  . Alcohol use: Yes    Alcohol/week: 3.0 standard drinks    Types: 2 Glasses of wine, 1 Shots of liquor per week  . Drug use: No    Review of Systems  Constitutional:   No fever or chills.   ENT:   No sore throat. No rhinorrhea. Cardiovascular:   No chest pain or syncope. Respiratory:   No dyspnea or cough. Gastrointestinal:   Negative for abdominal pain, positive vomiting.  No constipation or diarrhea.  Musculoskeletal:   Negative for focal pain or swelling All other systems reviewed and are negative except as documented above in ROS and HPI.  ____________________________________________   PHYSICAL EXAM:  VITAL SIGNS: ED Triage Vitals [04/22/18 1050]  Enc Vitals Group     BP 112/76     Pulse Rate (!) 108     Resp 20     Temp 98 F (36.7 C)     Temp Source Oral     SpO2 100 %     Weight 112 lb (50.8 kg)     Height 4\' 10"  (1.473 m)     Head Circumference      Peak Flow      Pain Score 0     Pain Loc      Pain Edu?      Excl. in [place]?     Vital signs reviewed, nursing assessments reviewed.   Constitutional:   Alert and oriented. Non-toxic appearance. Eyes:   Conjunctivae are normal. EOMI. PERRL. ENT      Head:   Normocephalic and atraumatic.      Nose:   No congestion/rhinnorhea.       Mouth/Throat:   Dry mucous membranes, no pharyngeal erythema. No peritonsillar mass.       Neck:   No meningismus. Full ROM. Hematological/Lymphatic/Immunilogical:   No cervical lymphadenopathy. Cardiovascular:   RRR. Symmetric bilateral radial and DP pulses.  No murmurs. Cap refill less than 2 seconds. Respiratory:   Normal respiratory effort without tachypnea/retractions. Breath sounds are clear and equal bilaterally. No wheezes/rales/rhonchi. Gastrointestinal:   Soft and nontender. Non distended. There is no CVA tenderness.  No rebound, rigidity, or guarding.  Musculoskeletal:   Normal range of motion in all extremities. No joint effusions.  No lower extremity tenderness.  No edema. Neurologic:   Normal speech and language.  Motor grossly intact. No acute focal neurologic deficits are appreciated.  Skin:    Skin is warm, dry and intact. No rash noted.  No petechiae,  purpura, or bullae.  ____________________________________________    LABS (pertinent positives/negatives) (all labs ordered are listed, but only abnormal results are displayed) Labs Reviewed - No data to display ____________________________________________   EKG    ____________________________________________    RADIOLOGY  No results found.  ____________________________________________   PROCEDURES Procedures  ____________________________________________    CLINICAL IMPRESSION / ASSESSMENT AND PLAN / ED COURSE  Pertinent labs & imaging results that were available during my care of the patient were reviewed by me and considered in  my medical decision making (see chart for details).    Patient presents with symptoms of recurrent migraine.  Vital signs are unremarkable except for mild tachycardia which I attribute to pain and mild dehydration.  Give the patient IV fluids, migraine cocktail.  She is continuing her oral medications to attempt to control her migraine symptoms and awaiting a referral appointment to the [facility] in [place] as of Apr 22 1454  Tue Apr 22, 2018  1131 P/w severe sx of recurrent migraine headache. No other unusual sx. Exam reassuring. Doubt sig. W/d syndrome such as benzos. Will tx with ivf, abortive meds   [PS]  1610 Patient sleeping, symptom-free.  Will p.o. trial.   [PS]    Clinical Course User Index [PS] Carrie Mew, MD     ----------------------------------------- 2:55 PM on 04/22/2018 -----------------------------------------  Sitting upright, eyes open, calm.  Talking on phone.  Feels much better and ready to go home.  She reports that she is tolerating oral intake.  Doubt any acute CNS process such as stroke intracranial hemorrhage meningitis encephalitis or intracranial hypertension.  Continue home medication regimen, add on Reglan Benadryl as needed.  Follow-up with headache  specialist.  ____________________________________________   FINAL CLINICAL IMPRESSION(S) / ED DIAGNOSES    Final diagnoses:  Other migraine with status migrainosus, not intractable     ED Discharge Orders         Ordered    metoCLOPramide (REGLAN) 10 MG tablet  Every 6 hours PRN     04/22/18 1454    diphenhydrAMINE (BENADRYL) 25 mg capsule  Every 6 hours PRN     04/22/18 1454          Portions of this note were generated with dragon dictation software. Dictation errors may occur despite best attempts at proofreading.    Carrie Mew, MD 04/22/18 1455

## 2018-04-22 NOTE — ED Notes (Signed)
Patient denies pain and is resting comfortably.  

## 2018-04-29 ENCOUNTER — Ambulatory Visit: Payer: Self-pay | Admitting: Dietician

## 2018-05-05 ENCOUNTER — Ambulatory Visit: Payer: Self-pay | Admitting: Psychiatry

## 2018-05-15 ENCOUNTER — Telehealth: Payer: Self-pay | Admitting: Physician Assistant

## 2018-05-15 DIAGNOSIS — G43101 Migraine with aura, not intractable, with status migrainosus: Secondary | ICD-10-CM

## 2018-05-15 NOTE — Telephone Encounter (Signed)
Pt needing a call back regarding her medication prescribed for muscle spasms. She is needing a refill but unsure of the name of the medication.  Please call pt back to discuss.  Please advise.  Thanks, [employer]

## 2018-05-16 MED ORDER — CYCLOBENZAPRINE HCL 5 MG PO TABS: 5.0000 mg | ORAL_TABLET | Freq: Three times a day (TID) | ORAL | 1 refills | Status: DC | PRN

## 2018-05-16 MED ORDER — METAXALONE 800 MG PO TABS: 800.0000 mg | ORAL_TABLET | Freq: Three times a day (TID) | ORAL | 0 refills | Status: DC

## 2018-05-16 NOTE — Telephone Encounter (Signed)
Patient was advised she states that when she spoke with CMA she informed them that Skelaxin is not helping with symptoms and preferred for Flexeril to be called in. Please advise. KW

## 2018-05-16 NOTE — Telephone Encounter (Signed)
Patient was advised.  

## 2018-05-16 NOTE — Addendum Note (Signed)
Addended by: Mar Daring on: 05/16/2018 11:19 AM   Modules accepted: Orders

## 2018-05-16 NOTE — Telephone Encounter (Signed)
changed

## 2018-05-16 NOTE — Telephone Encounter (Signed)
Skelaxin is her muscle relaxer and this was refilled.

## 2018-06-13 ENCOUNTER — Encounter: Payer: Self-pay | Admitting: Neurology

## 2018-06-13 ENCOUNTER — Encounter: Payer: Self-pay | Admitting: Physician Assistant

## 2018-06-13 ENCOUNTER — Ambulatory Visit (INDEPENDENT_AMBULATORY_CARE_PROVIDER_SITE_OTHER): Payer: BLUE CROSS/BLUE SHIELD | Admitting: Physician Assistant

## 2018-06-13 VITALS — BP 110/78 | HR 87 | Temp 97.6°F | Resp 16 | Wt 112.0 lb

## 2018-06-13 DIAGNOSIS — G43111 Migraine with aura, intractable, with status migrainosus: Secondary | ICD-10-CM | POA: Diagnosis not present

## 2018-06-13 MED ORDER — CYCLOBENZAPRINE HCL 10 MG PO TABS: 10.0000 mg | ORAL_TABLET | Freq: Three times a day (TID) | ORAL | 5 refills | Status: DC | PRN

## 2018-06-13 MED ORDER — AMITRIPTYLINE HCL 25 MG PO TABS: 25.0000 mg | ORAL_TABLET | Freq: Every day | ORAL | 1 refills | Status: DC

## 2018-06-13 NOTE — Patient Instructions (Addendum)
Botox/OnabotulinumtoxinA injection (Medical Use) What is this medicine? ONABOTULINUMTOXINA (o na BOTT you lye num tox in eh) is a neuro-muscular blocker. This medicine is used to treat crossed eyes, eyelid spasms, severe neck muscle spasms, ankle and toe muscle spasms, and elbow, wrist, and finger muscle spasms. It is also used to treat excessive underarm sweating, to prevent chronic migraine headaches, and to treat loss of bladder control due to neurologic conditions such as multiple sclerosis or spinal cord injury. This medicine may be used for other purposes; ask your health care provider or pharmacist if you have questions. COMMON BRAND NAME(S): Botox What should I tell my health care provider before I take this medicine? They need to know if you have any of these conditions: -breathing problems -cerebral palsy spasms -difficulty urinating -heart problems -history of surgery where this medicine is going to be used -infection at the site where this medicine is going to be used -myasthenia gravis or other neurologic disease -nerve or muscle disease -surgery plans -take medicines that treat or prevent blood clots -thyroid problems -an unusual or allergic reaction to botulinum toxin, albumin, other medicines, foods, dyes, or preservatives -pregnant or trying to get pregnant -breast-feeding How should I use this medicine? This medicine is for injection into a muscle. It is given by a health care professional in a hospital or clinic setting. Talk to your pediatrician regarding the use of this medicine in children. While this drug may be prescribed for children as young as 36 years old for selected conditions, precautions do apply. Overdosage: If you think you have taken too much of this medicine contact a poison control center or emergency room at once. NOTE: This medicine is only for you. Do not share this medicine with others. What if I miss a dose? This does not apply. What may interact  with this medicine? -aminoglycoside antibiotics like gentamicin, neomycin, tobramycin -muscle relaxants -other botulinum toxin injections This list may not describe all possible interactions. Give your health care provider a list of all the medicines, herbs, non-prescription drugs, or dietary supplements you use. Also tell them if you smoke, drink alcohol, or use illegal drugs. Some items may interact with your medicine. What should I watch for while using this medicine? Visit your doctor for regular check ups. This medicine will cause weakness in the muscle where it is injected. Tell your doctor if you feel unusually weak in other muscles. Get medical help right away if you have problems with breathing, swallowing, or talking. This medicine might make your eyelids droop or make you see blurry or double. If you have weak muscles or trouble seeing do not drive a car, use machinery, or do other dangerous activities. This medicine contains albumin from human blood. It may be possible to pass an infection in this medicine, but no cases have been reported. Talk to your doctor about the risks and benefits of this medicine. If your activities have been limited by your condition, go back to your regular routine slowly after treatment with this medicine. What side effects may I notice from receiving this medicine? Side effects that you should report to your doctor or health care professional as soon as possible: -allergic reactions like skin rash, itching or hives, swelling of the face, lips, or tongue -breathing problems -changes in vision -chest pain or tightness -eye irritation, pain -fast, irregular heartbeat -infection -numbness -speech problems -swallowing problems -unusual weakness Side effects that usually do not require medical attention (report to your doctor or health care  professional if they continue or are bothersome): -bruising or pain at site where injected -drooping eyelid -dry  eyes or mouth -headache -muscles aches, pains -sensitivity to light -tearing This list may not describe all possible side effects. Call your doctor for medical advice about side effects. You may report side effects to FDA at 1-800-FDA-1088. Where should I keep my medicine? This drug is given in a hospital or clinic and will not be stored at home. NOTE: This sheet is a summary. It may not cover all possible information. If you have questions about this medicine, talk to your doctor, pharmacist, or health care provider.  2019 Elsevier/Gold Standard (2017-11-18 14:21:42)

## 2018-06-13 NOTE — Progress Notes (Signed)
Patient: Jacqueline Wong Female    DOB: November 23, 1970   48 y.o.   MRN: [id number] Visit Date: 06/13/2018  Today's Provider: Mar Daring, PA-C   Chief Complaint  Patient presents with  . Migraine   Subjective:     HPI   Chronic migraine without aura without status migrainosus, not intractable From 02/13/2018-Followed by Neuro. Had MRI earlier this week. Results not available. Has f/u with Dr. Melrose Nakayama next week.   Patient states Dr. Melrose Nakayama is out of Network now with new insurance changes. Was referred to [facility] but was never seen. Also had Sphenopalatine Ganglion + Maxillary Division of Trigeminal Nerve Block by Dr. Manuella Ghazi but reports having severe adverse reactions from it. She has also tried propranolol, topamax, amitriptyline, nortriptyline, and aimovig. She uses Imitrex, flexeril and occasionally zofran for breakthrough migraines. She reports having headache almost daily. Symptoms only vary by severity daily. She is interested in trying Botox injections at this time for prevention.    No Known Allergies   Current Outpatient Medications:  .  cyclobenzaprine (FLEXERIL) 5 MG tablet, Take 1 tablet (5 mg total) by mouth 3 (three) times daily as needed for muscle spasms., Disp: 90 tablet, Rfl: 1 .  SUMAtriptan (IMITREX) 100 MG tablet, TAKE 1 TABLET BY MOUTH AT ONSET OF MIGRAINE. MAY REPEAT IN 2 HOURS IF HEADACHE PERSISTS OR RECURS. MAX 2 TABLET DAILY, Disp: 10 tablet, Rfl: 5 .  ALPRAZolam (XANAX) 0.5 MG tablet, Take 1 tablet (0.5 mg total) by mouth 2 (two) times daily as needed for anxiety. (Patient not taking: Reported on 06/13/2018), Disp: 60 tablet, Rfl: 5 .  diphenhydrAMINE (BENADRYL) 25 mg capsule, Take 2 capsules (50 mg total) by mouth every 6 (six) hours as needed. (Patient not taking: Reported on 06/13/2018), Disp: 60 capsule, Rfl: 0 .  metoCLOPramide (REGLAN) 10 MG tablet, Take 1 tablet (10 mg total) by mouth every 6 (six) hours as needed. (Patient not  taking: Reported on 06/13/2018), Disp: 30 tablet, Rfl: 0 .  nortriptyline (PAMELOR) 10 MG capsule, Start Nortriptyline (Pamelor) 10 mg nightly for one week, then increase to 20 mg nightly, Disp: , Rfl:  .  ondansetron (ZOFRAN) 4 MG tablet, Take 1 tablet (4 mg total) by mouth every 8 (eight) hours as needed for nausea or vomiting. (Patient not taking: Reported on 06/13/2018), Disp: 20 tablet, Rfl: 5 .  venlafaxine XR (EFFEXOR-XR) 75 MG 24 hr capsule, TAKE ONE CAPSULE BY MOUTH DAILY WITH BREAKFAST. MAY INCREASE TO 2 CAPSULES DAILY IF NEEDED AFTER 2 WEEKS (Patient not taking: Reported on 06/13/2018), Disp: 60 capsule, Rfl: 5  Review of Systems  Constitutional: Negative for appetite change, chills, fatigue and fever.  Respiratory: Negative for chest tightness and shortness of breath.   Cardiovascular: Negative for chest pain and palpitations.  Gastrointestinal: Negative for abdominal pain, nausea and vomiting.  Neurological: Positive for headaches. Negative for dizziness and weakness.    Social History   Tobacco Use  . Smoking status: Never Smoker  . Smokeless tobacco: Never Used  Substance Use Topics  . Alcohol use: Yes    Alcohol/week: 3.0 standard drinks    Types: 2 Glasses of wine, 1 Shots of liquor per week      Objective:   BP 110/78 (BP Location: Left Arm, Patient Position: Sitting, Cuff Size: Normal)   Pulse 87   Temp 97.6 F (36.4 C) (Oral)   Resp 16   Wt 112 lb (50.8 kg)   LMP 03/13/2016  SpO2 99%   BMI 23.41 kg/m  Vitals:   06/13/18 1049  BP: 110/78  Pulse: 87  Resp: 16  Temp: 97.6 F (36.4 C)  TempSrc: Oral  SpO2: 99%  Weight: 112 lb (50.8 kg)     Physical Exam Vitals signs reviewed.  Constitutional:      General: She is not in acute distress.    Appearance: She is well-developed. She is not diaphoretic.  Neck:     Musculoskeletal: Normal range of motion and neck supple.     Thyroid: No thyromegaly.     Vascular: No JVD.     Trachea: No tracheal  deviation.  Cardiovascular:     Rate and Rhythm: Normal rate and regular rhythm.     Heart sounds: Normal heart sounds. No murmur. No friction rub. No gallop.   Pulmonary:     Effort: Pulmonary effort is normal. No respiratory distress.     Breath sounds: Normal breath sounds. No wheezing or rales.  Lymphadenopathy:     Cervical: No cervical adenopathy.  Neurological:     General: No focal deficit present.        Assessment & Plan    1. Intractable migraine with aura with status migrainosus Referral placed to [facility] for headache clinic since Dr. Melrose Nakayama is no longer in network. She is going to see Dr. Vickki Muff, plastic surgery for consideration of Botox injections (has seen her already for eyelid surgery). Flexeril refilled. Switched back to amitriptyline from nortriptyline because she felt this worked better. Call if worsening.  - Ambulatory referral to Neurology - cyclobenzaprine (FLEXERIL) 10 MG tablet; Take 1 tablet (10 mg total) by mouth 3 (three) times daily as needed for muscle spasms.  Dispense: 90 tablet; Refill: 5 - amitriptyline (ELAVIL) 25 MG tablet; Take 1 tablet (25 mg total) by mouth at bedtime.  Dispense: 90 tablet; Refill: [place], PA-C  [facility]

## 2018-08-21 ENCOUNTER — Ambulatory Visit: Payer: BLUE CROSS/BLUE SHIELD | Admitting: Neurology

## 2018-09-10 ENCOUNTER — Telehealth: Payer: Self-pay

## 2018-09-10 NOTE — Telephone Encounter (Signed)
Attempted to contact pt prior to virtual visit with Dr. Tomi Likens.  No answer.  No VM.  Unable to leave message.

## 2018-09-10 NOTE — Consult Note (Signed)
Virtual Visit via Video Note The purpose of this virtual visit is to provide medical care while limiting exposure to the novel coronavirus.    Consent was obtained for video visit:  Yes.   Answered questions that patient had about telehealth interaction:  Yes.   I discussed the limitations, risks, security and privacy concerns of performing an evaluation and management service by telemedicine. I also discussed with the patient that there may be a patient responsible charge related to this service. The patient expressed understanding and agreed to proceed.  Pt location: Home Physician Location: office Name of referring provider:  Mar Daring, P* I connected with [name] at patients initiation/request on 09/11/2018 at  1:00 PM EDT by video enabled telemedicine application and verified that I am speaking with the correct person using two identifiers. Pt MRN:  [id number] Pt DOB:  [birth year] Video Participants:  [name]   History of Present Illness:  Jacqueline Wong is a 48 year old woman with scoliosis and kidney stones who presents for migraines.  History supplemented by referring provider and prior neurologist notes.  Onset:  Since junior high school Location:  Starts in occipital region and neck and radiates upward to right temporal region, retro-orbital Quality:  Pressure/non-throbbing Intensity:  Usually 4/10 but fluctuates to 9-10/10.  She denies new headache, thunderclap headache or severe headache that wakes her from sleep. Aura:  Sometimes phantosmia (burning smell) Premonitory Phase:  no Postdrome:  no Associated symptoms:  Nausea, vomiting, photophobia, phonophobia, osmophobia.  She denies associated unilateral numbness or weakness. Duration:  Always with a 4/10.  Intense headaches last 2 days Frequency:  1 to 2 times a week (2-4 days a week total) Frequency of abortive medication:  Triggers:  None Relieving factors:  rest Activity:  Aggravates   She had an MRI of the brain on 02/03/18 which was reportedly unremarkable.  Current NSAIDS:  none Current analgesics: Tylenol Current triptans:  Sumatriptan 100mg  Current ergotamine:  none Current anti-emetic:  Phenergan, Zofran Current muscle relaxants:  cyclobenzaprine Current anti-anxiolytic:  none Current sleep aide:  none Current Antihypertensive medications:  none Current Antidepressant medications:  None Current Anticonvulsant medications: none Current anti-CGRP:  none Current Vitamins/Herbal/Supplements:  none Current Antihistamines/Decongestants:  none Other therapy:  Essential oils, anti-inflammatory diet Hormone/birth control:  none  Past NSAIDS:  Ibuprofen, diclofenac Past analgesics:  Excedrin, Tylenol Past abortive triptans:  none Past abortive ergotamine:  none Past muscle relaxants:  none Past anti-emetic:  Promethazine, Reglan, Zofran Past antihypertensive medications:  none Past antidepressant medications:  Amitriptyline, nortriptyline, venlafaxine XR 75mg  Past anticonvulsant medications:  Topiramate, gabapentin Past anti-CGRP:  Aimovig (reportedly elevated blood sugars, constipation, stomach cramping) Past vitamins/Herbal/Supplements:  none Past antihistamines/decongestants:  Benadryl Other past therapies:  Sphenopalatine ganglion block  Caffeine:  No coffee or soda Diet:  Anti-inflammatory diet (no sugar or dairy).  100 oz water daily Exercise:  Yoga, stretching Depression:  maybe; Anxiety:  maybe Other pain:  no Sleep hygiene:  Usually okay Family history of headache:  Paternal grandmother  02/13/18 LABS:  CBC with WBC 5.2, Hgb 11.5, HCT 34.8, PLT 235; CMP with NA 142, K4.4, CL 102, CO2 25, glucose 86, BUN 10, CR 0.71, T bili<0.2, ALP 61, AST 13, ALT 11; TSH 2.0 70  Past Medical History: Past Medical History:  Diagnosis Date  . Allergy   . Anxiety   . Arthritis   . Heart murmur   . Kidney stone   . Kidney stones 1987  .  Migraine   . Scoliosis    . Teeth grinding     Medications: Outpatient Encounter Medications as of 09/11/2018  Medication Sig  . cyclobenzaprine (FLEXERIL) 10 MG tablet Take 1 tablet (10 mg total) by mouth 3 (three) times daily as needed for muscle spasms.  . SUMAtriptan (IMITREX) 100 MG tablet TAKE 1 TABLET BY MOUTH AT ONSET OF MIGRAINE. MAY REPEAT IN 2 HOURS IF HEADACHE PERSISTS OR RECURS. MAX 2 TABLET DAILY  . amitriptyline (ELAVIL) 25 MG tablet Take 1 tablet (25 mg total) by mouth at bedtime. (Patient not taking: Reported on 09/11/2018)  . diphenhydrAMINE (BENADRYL) 25 mg capsule Take 2 capsules (50 mg total) by mouth every 6 (six) hours as needed. (Patient not taking: Reported on 06/13/2018)  . metoCLOPramide (REGLAN) 10 MG tablet Take 1 tablet (10 mg total) by mouth every 6 (six) hours as needed. (Patient not taking: Reported on 06/13/2018)  . naproxen (NAPROSYN) 500 MG tablet Take 1 tablet (500 mg total) by mouth 2 (two) times daily as needed (With each dose of sumatriptan).  . ondansetron (ZOFRAN) 4 MG tablet Take 1 tablet (4 mg total) by mouth every 8 (eight) hours as needed for nausea or vomiting. (Patient not taking: Reported on 06/13/2018)  . propranolol ER (INDERAL LA) 60 MG 24 hr capsule Take 1 capsule (60 mg total) by mouth daily.   No facility-administered encounter medications on file as of 09/11/2018.     Allergies: No Known Allergies  Family History: Family History  Problem Relation Age of Onset  . Osteoporosis Mother   . Thyroid cancer Sister   . Migraines Daughter   . Kidney Stones Daughter   . Cancer Maternal Grandmother   . Heart disease Paternal Grandmother        heart attack  . Cancer Paternal Grandfather   . Cancer Other        Thyroid  . Prostate cancer Neg Hx   . Kidney disease Neg Hx   . Bladder Cancer Neg Hx     Social History: Social History   Socioeconomic History  . Marital status: Married    Spouse name: Not on file  . Number of children: Not on file  . Years of  education: Not on file  . Highest education level: Not on file  Occupational History  . Not on file  Social Needs  . Financial resource strain: Not on file  . Food insecurity:    Worry: Not on file    Inability: Not on file  . Transportation needs:    Medical: Not on file    Non-medical: Not on file  Tobacco Use  . Smoking status: Never Smoker  . Smokeless tobacco: Never Used  Substance and Sexual Activity  . Alcohol use: Yes    Alcohol/week: 3.0 standard drinks    Types: 2 Glasses of wine, 1 Shots of liquor per week  . Drug use: No  . Sexual activity: Yes    Birth control/protection: Surgical  Lifestyle  . Physical activity:    Days per week: Not on file    Minutes per session: Not on file  . Stress: Not on file  Relationships  . Social connections:    Talks on phone: Not on file    Gets together: Not on file    Attends religious service: Not on file    Active member of club or organization: Not on file    Attends meetings of clubs or organizations: Not on file  Relationship status: Not on file  . Intimate partner violence:    Fear of current or ex partner: Not on file    Emotionally abused: Not on file    Physically abused: Not on file    Forced sexual activity: Not on file  Other Topics Concern  . Not on file  Social History Narrative  . Not on file   Observations/Objective:   Height 4\' 9"  (1.448 m), weight 107 lb (48.5 kg), last menstrual period 03/13/2016. No acute distress.  Alert and oriented.  Speech fluent and not dysarthric.  Language intact.  Eyes orthophoric and move in all directions.  Face symmetric    Assessment and Plan:   Chronic migraine without aura, without status migrainosus, not intractable Migraine with aura, without status migrainosus, not intractable  She would meet criteria for Botox, however we are not currently performing Botox at the moment due to the pandemic.  Instead, we will try a beta blocker  1. For preventative management,  propranolol ER 60mg  daily.  We can increase dose to 80mg  daily in 4 weeks if needed.  Cautioned about possible lightheadedness and should contact us if she experiences these symptoms. 2.  For abortive therapy, she will try taking naproxen 500mg  with each dose of sumatriptan 100mg  3.  Limit use of pain relievers to no more than 2 days out of week to prevent risk of rebound or medication-overuse headache. 4.  Keep headache diary 5.  Exercise, hydration, caffeine cessation, sleep hygiene, monitor for and avoid triggers 6.  Consider:  magnesium citrate 400mg  daily, riboflavin 400mg  daily, and coenzyme Q10 100mg  three times daily 7.  Follow up in 4 months   Follow Up Instructions:    -I discussed the assessment and treatment plan with the patient. The patient was provided an opportunity to ask questions and all were answered. The patient agreed with the plan and demonstrated an understanding of the instructions.   The patient was advised to call back or seek an in-person evaluation if the symptoms worsen or if the condition fails to improve as anticipated.   Dudley Major, DO

## 2018-09-11 ENCOUNTER — Telehealth (INDEPENDENT_AMBULATORY_CARE_PROVIDER_SITE_OTHER): Payer: BLUE CROSS/BLUE SHIELD | Admitting: Neurology

## 2018-09-11 ENCOUNTER — Encounter: Payer: Self-pay | Admitting: Neurology

## 2018-09-11 ENCOUNTER — Other Ambulatory Visit: Payer: Self-pay

## 2018-09-11 VITALS — Ht <= 58 in | Wt 107.0 lb

## 2018-09-11 DIAGNOSIS — G43109 Migraine with aura, not intractable, without status migrainosus: Secondary | ICD-10-CM

## 2018-09-11 DIAGNOSIS — G43709 Chronic migraine without aura, not intractable, without status migrainosus: Secondary | ICD-10-CM | POA: Diagnosis not present

## 2018-09-11 MED ORDER — PROPRANOLOL HCL ER 60 MG PO CP24: 60.0000 mg | ORAL_CAPSULE | Freq: Every day | ORAL | 3 refills | Status: DC

## 2018-09-11 MED ORDER — NAPROXEN 500 MG PO TABS: 500.0000 mg | ORAL_TABLET | Freq: Two times a day (BID) | ORAL | 3 refills | Status: DC | PRN

## 2018-09-11 NOTE — Patient Instructions (Signed)
1. For preventative management, propranolol ER 60mg  daily.  We can increase dose to 80mg  daily in 4 weeks if needed.  Cautioned about possible lightheadedness and should contact us if she experiences these symptoms. 2.  For abortive therapy, she will try taking naproxen 500mg  with each dose of sumatriptan 100mg  3.  Limit use of pain relievers to no more than 2 days out of week to prevent risk of rebound or medication-overuse headache. 4.  Keep headache diary 5.  Exercise, hydration, caffeine cessation, sleep hygiene, monitor for and avoid triggers 6.  Consider:  magnesium citrate 400mg  daily, riboflavin 400mg  daily, and coenzyme Q10 100mg  three times daily 7.  Follow up in 4 months

## 2018-09-11 NOTE — Progress Notes (Signed)
See note

## 2018-10-30 ENCOUNTER — Ambulatory Visit: Payer: Self-pay | Admitting: Neurology

## 2018-10-30 ENCOUNTER — Encounter

## 2018-11-28 ENCOUNTER — Other Ambulatory Visit: Payer: Self-pay | Admitting: Physician Assistant

## 2018-11-28 DIAGNOSIS — G43109 Migraine with aura, not intractable, without status migrainosus: Secondary | ICD-10-CM

## 2019-01-06 NOTE — Progress Notes (Signed)
NEUROLOGY FOLLOW UP OFFICE NOTE  Jacqueline Wong [id number]  HISTORY OF PRESENT ILLNESS: Jacqueline Wong is a 48 year old woman with scoliosis and kidney stones who follows up for migraines.  UPDATE: Stopped propranolol ER 60mg  because it wasn't effective and she thought it was "messing with my sugar levels" and caused weight gain. Intensity:  9-10/10 for day one and then 4/10 for subsequent days. Duration:  1-3 days Frequency:  15-16 headache days a month Frequency of abortive medication: sumatriptan with naproxen 2-3 days a week.  2 Tylenol daily for back pain. Rescue therapy: Sumatriptan with naproxen Current NSAIDS:   Naproxen 500 mg Current analgesics: Tylenol (for back pain) Current triptans:  Sumatriptan 100mg  Current ergotamine:  none Current anti-emetic:  Phenergan, Zofran Current muscle relaxants:  cyclobenzaprine (for back pain) Current anti-anxiolytic:  none Current sleep aide:  none Current Antihypertensive medications:   none Current Antidepressant medications:  None Current Anticonvulsant medications: none Current anti-CGRP:  none Current Vitamins/Herbal/Supplements:  none Current Antihistamines/Decongestants:  none Other therapy:  Essential oils, anti-inflammatory diet Hormone/birth control:  none  Caffeine:  No coffee or soda Diet:  Anti-inflammatory diet (no sugar or dairy).  100 oz water daily Exercise:  Yoga, stretching Depression:  maybe; Anxiety:  maybe Other pain:  low back pain Sleep hygiene:  Usually okay  HISTORY: Onset: Since junior high school Location:  Starts in occipital region and neck and radiates upward to right temporal region, retro-orbital Quality:  Pressure/non-throbbing Initial intensity:  Usually 4/10 but fluctuates to 9-10/10.  She denies new headache, thunderclap headache or severe headache that wakes her from sleep. Aura:  Sometimes phantosmia (burning smell) Premonitory Phase:  no Postdrome:  no Associated symptoms:  Nausea, vomiting, photophobia, phonophobia, osmophobia.  She denies associated unilateral numbness or weakness. Initial duration:  Always with a 4/10.  Intense headaches last 2 days Initial Frequency:  1 to 2 times a week (2-4 days a week total) Initial Frequency of abortive medication:  Triggers: None Relieving factors: Rest Activity:  Aggravates  She had an MRI of the brain on 02/03/18 which was reportedly unremarkable.  Past NSAIDS:  Ibuprofen, diclofenac Past analgesics:  Excedrin, Tylenol Past abortive triptans:  none Past abortive ergotamine:  none Past muscle relaxants:  none Past anti-emetic:  Reglan Past antihypertensive medications:  propranolol ER 60mg  Past antidepressant medications:  Amitriptyline, nortriptyline, venlafaxine XR 75mg  Past anticonvulsant medications:  Topiramate, gabapentin Past anti-CGRP:  Aimovig (reportedly elevated blood sugars, constipation, stomach cramping) Past vitamins/Herbal/Supplements:  none Past antihistamines/decongestants:  Benadryl Other past therapies:  Sphenopalatine ganglion block  Family history of headache:  Paternal grandmother  PAST MEDICAL HISTORY: Past Medical History:  Diagnosis Date  . Allergy   . Anxiety   . Arthritis   . Heart murmur   . Kidney stone   . Kidney stones 1987  . Migraine   . Scoliosis   . Teeth grinding     MEDICATIONS: Outpatient Encounter Medications as of 01/07/2019  Medication Sig  . acetaminophen (TYLENOL) 500 MG tablet Take 500 mg by mouth every 8 (eight) hours as needed for headache.  . cyclobenzaprine (FLEXERIL) 10 MG tablet Take 1 tablet (10 mg total) by mouth 3 (three) times daily as needed for muscle spasms.  . diphenhydrAMINE (BENADRYL) 25 mg capsule Take 2 capsules (50 mg total) by mouth every 6 (six) hours as needed.  Marland Kitchen ibuprofen (ADVIL) 200 MG tablet Take 200 mg by mouth every 6 (six) hours as needed for headache.  . metoCLOPramide (REGLAN) 10  MG tablet Take 1 tablet (10 mg total) by  mouth every 6 (six) hours as needed.  . naproxen (NAPROSYN) 500 MG tablet Take 1 tablet (500 mg total) by mouth 2 (two) times daily as needed (With each dose of sumatriptan).  . ondansetron (ZOFRAN) 4 MG tablet Take 1 tablet (4 mg total) by mouth every 8 (eight) hours as needed for nausea or vomiting.  . [DISCONTINUED] SUMAtriptan (IMITREX) 100 MG tablet TAKE 1 TABLET BY MOUTH AT ONSET OF MIGRAINE. MAY REPEAT IN 2 HOURS IF HEADACHE PERSISTS OR RECURS. MAX 2 TABLET DAILY  . Ubrogepant (UBRELVY) 100 MG TABS Take 1 tablet by mouth as needed (May repeat after 2 hours.  Maximum 2 tablets in 24 hours).  . [DISCONTINUED] amitriptyline (ELAVIL) 25 MG tablet Take 1 tablet (25 mg total) by mouth at bedtime. (Patient not taking: Reported on 09/11/2018)  . [DISCONTINUED] propranolol ER (INDERAL LA) 60 MG 24 hr capsule Take 1 capsule (60 mg total) by mouth daily.   No facility-administered encounter medications on file as of 01/07/2019.     ALLERGIES: No Known Allergies  FAMILY HISTORY: Family History  Problem Relation Age of Onset  . Osteoporosis Mother   . Thyroid cancer Sister   . Migraines Daughter   . Kidney Stones Daughter   . Cancer Maternal Grandmother   . Heart disease Paternal Grandmother        heart attack  . Cancer Paternal Grandfather   . Cancer Other        Thyroid  . Prostate cancer Neg Hx   . Kidney disease Neg Hx   . Bladder Cancer Neg Hx     SOCIAL HISTORY: Social History   Socioeconomic History  . Marital status: Married    Spouse name: Not on file  . Number of children: Not on file  . Years of education: Not on file  . Highest education level: Not on file  Occupational History  . Not on file  Social Needs  . Financial resource strain: Not on file  . Food insecurity    Worry: Not on file    Inability: Not on file  . Transportation needs    Medical: Not on file    Non-medical: Not on file  Tobacco Use  . Smoking status: Never Smoker  . Smokeless tobacco: Never  Used  Substance and Sexual Activity  . Alcohol use: Yes    Alcohol/week: 3.0 standard drinks    Types: 2 Glasses of wine, 1 Shots of liquor per week  . Drug use: No  . Sexual activity: Yes    Birth control/protection: Surgical  Lifestyle  . Physical activity    Days per week: Not on file    Minutes per session: Not on file  . Stress: Not on file  Relationships  . Social Herbalist on phone: Not on file    Gets together: Not on file    Attends religious service: Not on file    Active member of club or organization: Not on file    Attends meetings of clubs or organizations: Not on file    Relationship status: Not on file  . Intimate partner violence    Fear of current or ex partner: Not on file    Emotionally abused: Not on file    Physically abused: Not on file    Forced sexual activity: Not on file  Other Topics Concern  . Not on file  Social History Narrative  . Not  on file    REVIEW OF SYSTEMS: Constitutional: No fevers, chills, or sweats, no generalized fatigue, change in appetite Eyes: No visual changes, double vision, eye pain Ear, nose and throat: No hearing loss, ear pain, nasal congestion, sore throat Cardiovascular: No chest pain, palpitations Respiratory:  No shortness of breath at rest or with exertion, wheezes GastrointestinaI: No nausea, vomiting, diarrhea, abdominal pain, fecal incontinence Genitourinary:  No dysuria, urinary retention or frequency Musculoskeletal:  No neck pain, back pain Integumentary: No rash, pruritus, skin lesions Neurological: as above Psychiatric: No depression, insomnia, anxiety Endocrine: No palpitations, fatigue, diaphoresis, mood swings, change in appetite, change in weight, increased thirst Hematologic/Lymphatic:  No purpura, petechiae. Allergic/Immunologic: no itchy/runny eyes, nasal congestion, recent allergic reactions, rashes  PHYSICAL EXAM: Blood pressure 105/72, pulse 92, temperature 98.6 F (37 C),  temperature source Oral, height 4\' 9"  (1.448 m), weight 121 lb (54.9 kg), last menstrual period 03/13/2016, SpO2 99 %. General: No acute distress.  Patient appears well-groomed.   Head:  Normocephalic/atraumatic Eyes:  Fundi examined but not visualized Neck: supple, right sided paraspinal tenderness, full range of motion Heart:  Regular rate and rhythm Lungs:  Clear to auscultation bilaterally Back: Bilateral paraspinal tenderness Neurological Exam: alert and oriented to person, place, and time. Attention span and concentration intact, recent and remote memory intact, fund of knowledge intact.  Speech fluent and not dysarthric, language intact.  CN II-XII intact. Bulk and tone normal, muscle strength 5/5 throughout.  Sensation to light touch  intact.  Deep tendon reflexes 2+ throughout, toes downgoing.  Finger to nose testing intact.  Gait normal, Romberg negative.  IMPRESSION: 1.  Migraine with aura, without status migrainosus, not intractable 2.  Chronic migraine without aura, without status migrainosus, not intractable.  Meets criteria for Botox (over 3 months of at least 15 headache days per month and has failed multiple preventatives such as propranolol, nortriptyline, topiramate and Aimovig) 3.  Cervicalgia 4.  Low back pain  PLAN: 1.  For preventative management, Botox 2.  For abortive therapy, stop sumatriptan and naproxen and start Ubrelvy 100mg .  Zofran or Phenergan for nausea. 3. For neck and back pain, refer to Sports Medicine for [facility].  Flexeril as already prescribed. 4.  Limit use of pain relievers to no more than 2 days out of week to prevent risk of rebound or medication-overuse headache. 5.  Keep headache diary 6.  Exercise, hydration, caffeine cessation, sleep hygiene, monitor for and avoid triggers 7.  Consider:  magnesium citrate 400mg  daily, riboflavin 400mg  daily, and coenzyme Q10 100mg  three times daily 8. Always keep in mind that currently taking a hormone or birth  control may be a possible trigger or aggravating factor for migraine. 9. Follow up for first round of Botox  Metta Clines, DO

## 2019-01-07 ENCOUNTER — Ambulatory Visit (INDEPENDENT_AMBULATORY_CARE_PROVIDER_SITE_OTHER): Payer: BLUE CROSS/BLUE SHIELD | Admitting: Neurology

## 2019-01-07 ENCOUNTER — Encounter: Payer: Self-pay | Admitting: Neurology

## 2019-01-07 ENCOUNTER — Other Ambulatory Visit: Payer: Self-pay

## 2019-01-07 VITALS — BP 105/72 | HR 92 | Temp 98.6°F | Ht <= 58 in | Wt 121.0 lb

## 2019-01-07 DIAGNOSIS — M542 Cervicalgia: Secondary | ICD-10-CM

## 2019-01-07 DIAGNOSIS — G43109 Migraine with aura, not intractable, without status migrainosus: Secondary | ICD-10-CM

## 2019-01-07 DIAGNOSIS — G43709 Chronic migraine without aura, not intractable, without status migrainosus: Secondary | ICD-10-CM

## 2019-01-07 DIAGNOSIS — M545 Low back pain: Secondary | ICD-10-CM

## 2019-01-07 DIAGNOSIS — G8929 Other chronic pain: Secondary | ICD-10-CM

## 2019-01-07 MED ORDER — UBRELVY 100 MG PO TABS: 100.0000 mg | ORAL_TABLET | ORAL | 0 refills | Status: DC

## 2019-01-07 MED ORDER — UBRELVY 100 MG PO TABS: 1.0000 | ORAL_TABLET | ORAL | 3 refills | Status: DC | PRN

## 2019-01-07 NOTE — Patient Instructions (Signed)
1.  Stop sumatriptan and naproxen.  When you get a migraine, take Ubrelvy 100mg .  May repeat dose once after 2 hours if needed.  Maximum 2 tablets in 24 hours. Continue Zofran or Phenergan for nausea. 2.  We will get prior-authorization for Botox 3.  For neck and back pain, will send you to Dr. Teresa Coombs of Sports Medicine. 4.  Limit use of pain relievers to no more than 2 days out of week to prevent risk of rebound or medication-overuse headache. 5.  Follow up in 4 months.

## 2019-01-15 ENCOUNTER — Other Ambulatory Visit: Payer: Self-pay | Admitting: Physician Assistant

## 2019-01-15 DIAGNOSIS — G43001 Migraine without aura, not intractable, with status migrainosus: Secondary | ICD-10-CM

## 2019-01-15 DIAGNOSIS — R110 Nausea: Secondary | ICD-10-CM

## 2019-01-30 ENCOUNTER — Encounter: Payer: Self-pay | Admitting: *Deleted

## 2019-01-30 NOTE — Progress Notes (Addendum)
Called to get current insurance info: [id number] rx bin [id number] grp [id number] and PCN indvdl  Jacqueline Wong (Key: [id number])  Your information has been submitted to [place]. [employer] [place] will review the request and fax you a determination directly, typically within 3 business days of your submission once all necessary information is received.  If [employer] has not responded in 3 business days or if you have any questions about your submission, contact [place] at [phone].  Jacqueline Wong (Key: [id number])  This request has received a Cancelled outcome.  This may mean either your patient does not have active coverage with this plan, this authorization was processed as a duplicate request, or an authorization was not needed for this medication.  Note any additional information provided by [facility] [place] at the bottom of this request, and contact [employer] [place] directly for further details.

## 2019-01-30 NOTE — Progress Notes (Addendum)
Jacqueline Wong (Key: [id number]) Rx #[id number] [name] 100MG  tablets   Form [employer] [place] Commercial Electronic Request Form (CB) Created 21 days ago Sent to Plan 6 days ago Plan Response 6 days ago Submit Clinical Questions 6 days ago Determination Unfavorable 2 days ago    Jacqueline Wong (Key: [id number]) Rx #: [id number] [name] 100MG  tablets   Form [employer] [place] Commercial Electronic Request Form (CB) Created 15 days ago Sent to Plan 6 minutes ago Plan Response 6 minutes ago Submit Clinical Questions less than a minute ago Determination Wait for Determination Please wait for [employer] Commercial cb central to return a determination.

## 2019-02-06 ENCOUNTER — Telehealth: Payer: Self-pay | Admitting: Neurology

## 2019-02-06 MED ORDER — RIZATRIPTAN BENZOATE 10 MG PO TABS: ORAL_TABLET | ORAL | 3 refills | Status: DC

## 2019-02-06 NOTE — Telephone Encounter (Signed)
Called and informed patient that insurance denied [name] and MD called in [place]. Called patient and she stated she was able to pick up the Ubrelvy for $20 dollars and it is "working wonderfully."    MD - do you want me to contact pharmacy / does the maxalt need to be cancelled?

## 2019-02-06 NOTE — Telephone Encounter (Signed)
Yes, please.

## 2019-02-06 NOTE — Addendum Note (Signed)
Addended by: Jesse Fall on: 02/06/2019 03:54 PM   Modules accepted: Orders

## 2019-02-06 NOTE — Telephone Encounter (Signed)
Insurance denied Jacqueline Wong because she has only tried one triptan.    Will try Maxalt 10mg :  1 tablet earliest onset of migraine, may repeat in 2 hours if needed (maximum 2 tablets in 24 hours)

## 2019-02-06 NOTE — Telephone Encounter (Signed)
Cancelled Maxalt in the computer as patient is able to get Ubrelvy and does not need Maxalt.  Called [facility] and verified that it is cancelled. Patient aware also.

## 2019-02-10 ENCOUNTER — Telehealth: Payer: Self-pay | Admitting: *Deleted

## 2019-02-10 ENCOUNTER — Telehealth: Payer: Self-pay | Admitting: Neurology

## 2019-02-10 ENCOUNTER — Other Ambulatory Visit: Payer: Self-pay | Admitting: Neurology

## 2019-02-10 MED ORDER — RIZATRIPTAN BENZOATE 10 MG PO TABS: ORAL_TABLET | ORAL | 3 refills | Status: DC

## 2019-02-10 NOTE — Telephone Encounter (Signed)
Jacqueline Wong [phone] called from [facility] stating that this provider is out of network for patient to receive Botox.  [employer] can approve today but patient needs to know it is out of network . [employer]BS stated that if they approve it the patient does not need to use it. Spoke to my Financial planner who stated to have them approve it and the patient can decide whether she wanted to use it or not.  Called [place] and made her aware of all above. She stated she changed insurances SOLELY for this and was told Dr. Tomi Likens would be covered. I gave her the name/number of the [place] and she will call her and discuss.  [name] - FYI above and I told patient I would make you aware of above and patient will be following up.

## 2019-02-10 NOTE — Telephone Encounter (Signed)
Please send rx in per patient. thanks

## 2019-02-10 NOTE — Progress Notes (Signed)
Sent notice to scan see telephone notes.  Pt is out of network and the approval is for 2 visits from 01/30/2019-07/29/2019 [id number] The authorization states if requesting extension of services will need to submit request on the last authorization date 07/29/2019. Be sure to include clinicals status and treatment plans as well as goals of the extension.  Phone conversation from Jonna Clark 02/10/2019 Jacqueline Wong [phone] called from [facility] stating that this provider is out of network for patient to receive Botox.  [employer] can approve today but patient needs to know it is out of network . [employer] stated that if they approve it the patient does not need to use it. Spoke to my Financial planner who stated to have them approve it and the patient can decide whether she wanted to use it or not.  Called [facility] and made her aware of all above. She stated she changed insurances SOLELY for this and was told Dr. Tomi Likens would be covered. I gave her the name/number of the [facility] and she will call her and discuss.  [name] - FYI above and I told patient I would make you aware of above and patient will be following up.

## 2019-02-10 NOTE — Progress Notes (Signed)
Maxalt sent to [facility]

## 2019-02-10 NOTE — Telephone Encounter (Signed)
Jacqueline Wong denied because patient only tried one triptan (sumatriptan).  Please let patient know that we will need to try a different triptan first.    I would like to order rizatriptan 10mg  tablet:  Take 1 tablet earliest onset of migraine.  May repeat after 2 hours if needed (maximum 2 tablets in 24 hours). #9, Refills 3.  Please confirm if patient is agreeable and we can send prescription.

## 2019-02-26 ENCOUNTER — Other Ambulatory Visit: Payer: Self-pay | Admitting: Physician Assistant

## 2019-02-26 DIAGNOSIS — G43111 Migraine with aura, intractable, with status migrainosus: Secondary | ICD-10-CM

## 2019-02-26 NOTE — Telephone Encounter (Signed)
It looks like the neuro docs took this off her list. Is she still taking this? It may have been accidentally discontinued.

## 2019-02-26 NOTE — Telephone Encounter (Signed)
Patient stated she is no longer taking amitriptyline.

## 2019-05-11 ENCOUNTER — Ambulatory Visit: Payer: BLUE CROSS/BLUE SHIELD | Admitting: Neurology

## 2020-01-22 NOTE — Progress Notes (Signed)
Established patient visit   Patient: Jacqueline Wong   DOB: 07-05-1970   49 y.o. Female  MRN: [id number] Visit Date: 01/25/2020  Today's healthcare provider: Mar Daring, PA-C   Chief Complaint  Patient presents with  . Migraine   Subjective    HPI  Patient here for a health assessment form to be filled out.  She also been having nerve pain down inside of L arm with pain and numbness in hand. She has been doing stretches.  Migraines: this is a chronic issue. Reports that they are about the same. Previously seen by Dr. Tomi Likens. Has not been seen since 01/07/19. Has been using amitriptyline 100mg  with not great relief. Had used Ubrevly and had good relief but insurance would not cover.   Scoliosis: She is having some discomfort in her lower back. She uses a heat pad.  Patient Active Problem List   Diagnosis Date Noted  . Scoliosis   . Increased intraocular pressure, bilateral 01/13/2018  . GAD (generalized anxiety disorder) 01/13/2018  . Teeth grinding 01/13/2018  . Arthralgia of right temporomandibular joint 01/13/2018  . Chronic migraine without aura without status migrainosus, not intractable 01/05/2018  . Kidney stones 05/28/1985   Past Medical History:  Diagnosis Date  . Allergy   . Anxiety   . Arthritis   . Heart murmur   . Kidney stone   . Kidney stones 1987  . Migraine   . Scoliosis   . Teeth grinding        Medications: Outpatient Medications Prior to Visit  Medication Sig  . acetaminophen (TYLENOL) 500 MG tablet Take 500 mg by mouth every 8 (eight) hours as needed for headache.  . diphenhydrAMINE (BENADRYL) 25 mg capsule Take 2 capsules (50 mg total) by mouth every 6 (six) hours as needed.  Marland Kitchen ibuprofen (ADVIL) 200 MG tablet Take 200 mg by mouth every 6 (six) hours as needed for headache.  . metoCLOPramide (REGLAN) 10 MG tablet Take 1 tablet (10 mg total) by mouth every 6 (six) hours as needed.  . naproxen (NAPROSYN) 500 MG tablet Take 1  tablet (500 mg total) by mouth 2 (two) times daily as needed (With each dose of sumatriptan).  . ondansetron (ZOFRAN) 4 MG tablet TAKE 1 TABLET(4 MG) BY MOUTH EVERY 8 HOURS AS NEEDED FOR NAUSEA OR VOMITING  . [DISCONTINUED] cyclobenzaprine (FLEXERIL) 10 MG tablet Take 1 tablet (10 mg total) by mouth 3 (three) times daily as needed for muscle spasms.  Marland Kitchen amitriptyline (ELAVIL) 100 MG tablet Take 1 tablet (100 mg total) by mouth at bedtime.  . [DISCONTINUED] rizatriptan (MAXALT) 10 MG tablet Take 1 tablet earliest onset of migraine.  May repeat in 2 hours if needed. Maximum 2 tablets in 24 hours  . [DISCONTINUED] Ubrogepant (UBRELVY) 100 MG TABS Take 1 tablet by mouth as needed (May repeat after 2 hours.  Maximum 2 tablets in 24 hours).  . [DISCONTINUED] Ubrogepant (UBRELVY) 100 MG TABS Take 100 mg by mouth as directed. May repeat in 2 hrs if needed. Max of 200 mg in 24 hrs.   No facility-administered medications prior to visit.    Review of Systems  Constitutional: Negative.   Respiratory: Negative.   Cardiovascular: Negative.   Musculoskeletal: Positive for arthralgias, back pain, myalgias, neck pain and neck stiffness.  Neurological: Positive for weakness, numbness and headaches.    Last CBC Lab Results  Component Value Date   WBC 5.2 02/13/2018   HGB 11.5 02/13/2018  HCT 34.8 02/13/2018   MCV 93 02/13/2018   MCH 30.6 02/13/2018   RDW 12.3 02/13/2018   PLT 235 16/05/930   Last metabolic panel Lab Results  Component Value Date   GLUCOSE 86 02/13/2018   NA 142 02/13/2018   K 4.4 02/13/2018   CL 102 02/13/2018   CO2 25 02/13/2018   BUN 10 02/13/2018   CREATININE 0.71 02/13/2018   GFRNONAA 100 02/13/2018   GFRAA 116 02/13/2018   CALCIUM 9.6 02/13/2018   PROT 7.2 02/13/2018   ALBUMIN 4.3 02/13/2018   LABGLOB 2.9 02/13/2018   AGRATIO 1.5 02/13/2018   BILITOT <0.2 02/13/2018   ALKPHOS 61 02/13/2018   AST 13 02/13/2018   ALT 11 02/13/2018   ANIONGAP 9 03/13/2016       Objective    BP 124/90 (BP Location: Left Arm, Patient Position: Sitting, Cuff Size: Normal)   Pulse 94   Temp 98.2 F (36.8 C) (Oral)   Resp 16   Ht 4\' 9"  (1.448 m)   Wt 119 lb (54 kg)   LMP 03/13/2016   BMI 25.75 kg/m  BP Readings from Last 3 Encounters:  01/25/20 124/90  01/07/19 105/72  06/13/18 110/78   Wt Readings from Last 3 Encounters:  01/25/20 119 lb (54 kg)  01/07/19 121 lb (54.9 kg)  09/11/18 107 lb (48.5 kg)      Physical Exam Vitals reviewed.  Constitutional:      General: She is not in acute distress.    Appearance: She is well-developed. She is not diaphoretic.  Neck:     Thyroid: No thyromegaly.     Vascular: No JVD.     Trachea: No tracheal deviation.  Cardiovascular:     Rate and Rhythm: Normal rate and regular rhythm.     Heart sounds: Normal heart sounds. No murmur heard.  No friction rub. No gallop.   Pulmonary:     Effort: Pulmonary effort is normal. No respiratory distress.     Breath sounds: Normal breath sounds. No wheezing or rales.  Musculoskeletal:     Cervical back: Normal range of motion and neck supple.  Lymphadenopathy:     Cervical: No cervical adenopathy.     CLINICAL DATA:  49 year old female with neck pain, radiculopathy involving the left upper extremity for 1 month. No known injury.  EXAM: CERVICAL SPINE - COMPLETE 4+ VIEW  COMPARISON:  None.  FINDINGS: Straightening of cervical lordosis. Normal prevertebral soft tissue contour. Cervicothoracic junction alignment is within normal limits. Normal cervical AP alignment. Normal C1-C2 alignment and joint spaces. No acute osseous abnormality identified.  Chronic appearing disc and endplate degeneration T5-T7 through C6-C7, most pronounced at the latter.  Negative visible upper chest.  IMPRESSION: No acute osseous abnormality identified in the cervical spine.   Electronically Signed   By: Genevie Ann M.D.   On: 01/27/2020 16:31  No results found for any  visits on 01/25/20.  Assessment & Plan     1. Chronic migraine without aura without status migrainosus, not intractable Worsening. Has tried multiple treatments without relief. Referred to Neurology and headache clinic without improvement. Medications that have helped are too expensive. Will try gabapentin as below. Will titrate as tolerated and needed.  - gabapentin (NEURONTIN) 100 MG capsule; Start with 1 tab PO q hs x 1 week, then increase to BID x 1 week, then can increase to TID if needed  Dispense: 90 capsule; Refill: 3  2. Adolescent idiopathic scoliosis of thoracolumbar region Noted on previous  imaging by a different provider. Will try gabapentin as below.  - gabapentin (NEURONTIN) 100 MG capsule; Start with 1 tab PO q hs x 1 week, then increase to BID x 1 week, then can increase to TID if needed  Dispense: 90 capsule; Refill: 3  3. Cervical radiculopathy Left side radiculopathy. Will get imaging as below. Start gabapentin. F/U in 3-4 weeks.  - DG Cervical Spine Complete; Future - gabapentin (NEURONTIN) 100 MG capsule; Start with 1 tab PO q hs x 1 week, then increase to BID x 1 week, then can increase to TID if needed  Dispense: 90 capsule; Refill: 3   No follow-ups on file.      Reynolds Bowl, PA-C, have reviewed all documentation for this visit. The documentation on 01/29/20 for the exam, diagnosis, procedures, and orders are all accurate and complete.   Rubye Beach  [facility] [phone] (phone) [phone] (fax)  [place]

## 2020-01-25 ENCOUNTER — Other Ambulatory Visit: Payer: Self-pay

## 2020-01-25 ENCOUNTER — Encounter: Payer: Self-pay | Admitting: Physician Assistant

## 2020-01-25 ENCOUNTER — Ambulatory Visit (INDEPENDENT_AMBULATORY_CARE_PROVIDER_SITE_OTHER): Payer: 59 | Admitting: Physician Assistant

## 2020-01-25 VITALS — BP 124/90 | HR 94 | Temp 98.2°F | Resp 16 | Ht <= 58 in | Wt 119.0 lb

## 2020-01-25 DIAGNOSIS — G43709 Chronic migraine without aura, not intractable, without status migrainosus: Secondary | ICD-10-CM | POA: Diagnosis not present

## 2020-01-25 DIAGNOSIS — M5412 Radiculopathy, cervical region: Secondary | ICD-10-CM

## 2020-01-25 DIAGNOSIS — M41125 Adolescent idiopathic scoliosis, thoracolumbar region: Secondary | ICD-10-CM

## 2020-01-25 DIAGNOSIS — M419 Scoliosis, unspecified: Secondary | ICD-10-CM | POA: Insufficient documentation

## 2020-01-25 MED ORDER — GABAPENTIN 100 MG PO CAPS: ORAL_CAPSULE | ORAL | 3 refills | Status: DC

## 2020-01-27 ENCOUNTER — Ambulatory Visit
Admission: RE | Admit: 2020-01-27 | Discharge: 2020-01-27 | Disposition: A | Discharge status: Home / Self Care | Payer: 59 | Attending: Physician Assistant | Admitting: Physician Assistant

## 2020-01-27 ENCOUNTER — Ambulatory Visit
Admission: RE | Admit: 2020-01-27 | Discharge: 2020-01-27 | Disposition: A | Discharge status: Home / Self Care | Payer: 59 | Source: Ambulatory Visit | Attending: Physician Assistant | Admitting: Physician Assistant

## 2020-01-27 ENCOUNTER — Other Ambulatory Visit: Payer: Self-pay

## 2020-01-27 DIAGNOSIS — M5412 Radiculopathy, cervical region: Secondary | ICD-10-CM

## 2020-01-29 ENCOUNTER — Telehealth: Payer: Self-pay

## 2020-01-29 ENCOUNTER — Encounter: Payer: Self-pay | Admitting: Physician Assistant

## 2020-01-29 ENCOUNTER — Other Ambulatory Visit: Payer: Self-pay | Admitting: Physician Assistant

## 2020-01-29 DIAGNOSIS — G43111 Migraine with aura, intractable, with status migrainosus: Secondary | ICD-10-CM

## 2020-01-29 MED ORDER — CYCLOBENZAPRINE HCL 10 MG PO TABS: 10.0000 mg | ORAL_TABLET | Freq: Three times a day (TID) | ORAL | 5 refills | Status: DC | PRN

## 2020-01-29 NOTE — Patient Instructions (Signed)

## 2020-01-29 NOTE — Telephone Encounter (Signed)
-----   Message from Mar Daring, [place] sent at 01/29/2020  8:56 AM EDT ----- There is straightening of the normal curvature of the cervical spine that is consistent with muscle tightness/spasm. Also there is arthritic changes at C4-C5 and C6-C7.

## 2020-01-29 NOTE — Telephone Encounter (Signed)
Medication Refill - Medication:  cyclobenzaprine (FLEXERIL) 10 MG tablet [[id number]   Has the patient contacted their pharmacy? No. (Agent: If no, request that the patient contact the pharmacy for the refill.) (Agent: If yes, when and what did the pharmacy advise?)  Preferred Pharmacy (with phone number or street name): [facility] [address]   Agent: Please be advised that RX refills may take up to 3 business days. We ask that you follow-up with your pharmacy.

## 2020-01-29 NOTE — Telephone Encounter (Signed)
There is straightening of the normal curvature of the cervical spine that is consistent with muscle tightness/spasm. Also there is arthritic changes at C4-C5 and C6-C7.  Written by Mar Daring, PA-C on 01/29/2020 8:56 AM EDT Seen by patient Jacqueline Wong on 01/29/2020 8:57 AM

## 2020-02-04 ENCOUNTER — Telehealth: Payer: Self-pay

## 2020-02-04 DIAGNOSIS — M5030 Other cervical disc degeneration, unspecified cervical region: Secondary | ICD-10-CM

## 2020-02-04 DIAGNOSIS — G43111 Migraine with aura, intractable, with status migrainosus: Secondary | ICD-10-CM

## 2020-02-04 MED ORDER — CYCLOBENZAPRINE HCL 10 MG PO TABS: 10.0000 mg | ORAL_TABLET | Freq: Three times a day (TID) | ORAL | 5 refills | Status: AC | PRN

## 2020-02-04 NOTE — Addendum Note (Signed)
Addended by: Mar Daring on: 02/04/2020 05:36 PM   Modules accepted: Orders

## 2020-02-04 NOTE — Telephone Encounter (Signed)
Patient advised as below and verbalized understanding. She states she would like a referral to both physical medicine and neurosurgery.

## 2020-02-04 NOTE — Telephone Encounter (Signed)
Referrals placed 

## 2020-02-04 NOTE — Telephone Encounter (Signed)
Patient called office stating that she has not heard back from [place] in regards to her xray report, she states that she did not hear what steps are going to be taken since her xray. Patient states that she also has a copy of MRI on a disc that she would like to drop off at office for [facility] to review. Patient would like her prescription for Flexeril to be called into [place] she states that is her new pharmacy. KW

## 2020-02-04 NOTE — Telephone Encounter (Signed)
Called and advised patient that we saw that she had seen the providers comment about her xray in mychart per patient she understand the xray results but still was waiting for a phone call to let her know what the next step is. She also reports that she had had called last week and asked the same thing and requested a refill for Flexeril and it was send in to the wrong pharmacy. This has been sent in for her to [facility] in [place]

## 2020-02-04 NOTE — Telephone Encounter (Signed)
Unfortunately there is not much I can do. I can refer her to physical medicine if she is interested in more conservative therapy, or neurosurgery.   I would recommend for her to keep the MRI disc and take to the referral as there is not much I can do with it here.

## 2020-02-17 ENCOUNTER — Other Ambulatory Visit: Payer: Self-pay | Admitting: Nurse Practitioner

## 2020-02-17 DIAGNOSIS — R29898 Other symptoms and signs involving the musculoskeletal system: Secondary | ICD-10-CM

## 2020-02-25 ENCOUNTER — Other Ambulatory Visit: Payer: Self-pay

## 2020-02-25 ENCOUNTER — Other Ambulatory Visit: Payer: Self-pay | Admitting: Nurse Practitioner

## 2020-02-25 ENCOUNTER — Ambulatory Visit
Admission: RE | Admit: 2020-02-25 | Discharge: 2020-02-25 | Disposition: A | Discharge status: Home / Self Care | Payer: 59 | Source: Ambulatory Visit | Attending: Nurse Practitioner | Admitting: Nurse Practitioner

## 2020-02-25 DIAGNOSIS — R29898 Other symptoms and signs involving the musculoskeletal system: Secondary | ICD-10-CM

## 2020-02-25 DIAGNOSIS — M5412 Radiculopathy, cervical region: Secondary | ICD-10-CM

## 2020-02-26 ENCOUNTER — Ambulatory Visit
Admission: RE | Admit: 2020-02-26 | Discharge: 2020-02-26 | Disposition: A | Discharge status: Home / Self Care | Payer: 59 | Source: Ambulatory Visit | Attending: Nurse Practitioner | Admitting: Nurse Practitioner

## 2020-02-26 DIAGNOSIS — M5412 Radiculopathy, cervical region: Secondary | ICD-10-CM

## 2020-02-28 ENCOUNTER — Other Ambulatory Visit: Payer: 59

## 2020-03-07 ENCOUNTER — Other Ambulatory Visit: Payer: 59

## 2020-03-15 ENCOUNTER — Encounter: Payer: Self-pay | Admitting: Physician Assistant

## 2020-03-15 DIAGNOSIS — G43709 Chronic migraine without aura, not intractable, without status migrainosus: Secondary | ICD-10-CM

## 2020-03-15 MED ORDER — NURTEC 75 MG PO TBDP: 75.0000 mg | ORAL_TABLET | ORAL | 11 refills | Status: AC

## 2020-03-15 NOTE — Addendum Note (Signed)
Addended by: Mar Daring on: 03/15/2020 01:09 PM   Modules accepted: Orders

## 2020-04-28 ENCOUNTER — Other Ambulatory Visit: Payer: Self-pay | Admitting: Neurosurgery

## 2020-05-12 ENCOUNTER — Other Ambulatory Visit: Payer: Self-pay

## 2020-05-12 ENCOUNTER — Encounter
Admission: RE | Admit: 2020-05-12 | Discharge: 2020-05-12 | Disposition: A | Discharge status: Home / Self Care | Payer: 59 | Source: Ambulatory Visit | Attending: Neurosurgery | Admitting: Neurosurgery

## 2020-05-12 DIAGNOSIS — M5412 Radiculopathy, cervical region: Secondary | ICD-10-CM | POA: Insufficient documentation

## 2020-05-12 DIAGNOSIS — Z01812 Encounter for preprocedural laboratory examination: Secondary | ICD-10-CM | POA: Insufficient documentation

## 2020-05-12 HISTORY — DX: Personal history of urinary calculi: Z87.442

## 2020-05-12 LAB — URINALYSIS, ROUTINE W REFLEX MICROSCOPIC
Bilirubin Urine: NEGATIVE
Glucose, UA: NEGATIVE mg/dL
Hgb urine dipstick: NEGATIVE
Ketones, ur: NEGATIVE mg/dL
Leukocytes,Ua: NEGATIVE
Nitrite: NEGATIVE
Protein, ur: NEGATIVE mg/dL
Specific Gravity, Urine: 1.003 — ABNORMAL LOW (ref 1.005–1.030)
pH: 7 (ref 5.0–8.0)

## 2020-05-12 LAB — SURGICAL PCR SCREEN
MRSA, PCR: NEGATIVE
Staphylococcus aureus: NEGATIVE

## 2020-05-12 NOTE — Patient Instructions (Addendum)
Your procedure is scheduled on: 12/22/21[facility] Report to the Registration Desk on the 1st floor of the Page. To find out your arrival time, please call [phone] between 1PM - 3PM on: 05/17/20- TUESDAY  REMEMBER: Instructions that are not followed completely may result in serious medical risk, up to and including death; or upon the discretion of your surgeon and anesthesiologist your surgery may need to be rescheduled.  Do not eat food after midnight the night before surgery.  No gum chewing, lozengers or hard candies.  You may however, drink CLEAR liquids up to 2 hours before you are scheduled to arrive for your surgery. Do not drink anything within 2 hours of your scheduled arrival time.  Clear liquids include: - water  - apple juice without pulp - gatorade (not RED, PURPLE, OR BLUE) - black coffee or tea (Do NOT add milk or creamers to the coffee or tea) Do NOT drink anything that is not on this list.  TAKE THESE MEDICATIONS THE MORNING OF SURGERY WITH A SIP OF WATER: - gabapentin (NEURONTIN) 300 MG capsule - Rimegepant Sulfate (NURTEC) 75 MG TBDP - acetaminophen (TYLENOL) 500 MG tablet AS DIRECTED IF NEEDED  Follow recommendations from Cardiologist, Pulmonologist or PCP regarding stopping Aspirin, Coumadin, Plavix, Eliquis, Pradaxa, or Pletal.  One week prior to surgery: STOP TAKING 05/12/20 Stop Anti-inflammatories (NSAIDS) such as Advil, Aleve, Ibuprofen, Motrin, Naproxen, Naprosyn and Aspirin based products such as Excedrin, Goodys Powder, BC Powder .  Stop ANY OVER THE COUNTER supplements until after surgery. (However, you may continue taking Vitamin D, Vitamin B, and multivitamin up until the day before surgery.)  No Alcohol for 24 hours before or after surgery.  No Smoking including e-cigarettes for 24 hours prior to surgery.  No chewable tobacco products for at least 6 hours prior to surgery.  No nicotine patches on the day of surgery.  Do not use  any "recreational" drugs for at least a week prior to your surgery.  Please be advised that the combination of cocaine and anesthesia may have negative outcomes, up to and including death. If you test positive for cocaine, your surgery will be cancelled.  On the morning of surgery brush your teeth with toothpaste and water, you may rinse your mouth with mouthwash if you wish. Do not swallow any toothpaste or mouthwash.  Do not wear jewelry, make-up, hairpins, clips or nail polish.  Do not wear lotions, powders, or perfumes.   Do not shave body from the neck down 48 hours prior to surgery just in case you cut yourself which could leave a site for infection.  Also, freshly shaved skin may become irritated if using the CHG soap.  Contact lenses, hearing aids and dentures may not be worn into surgery.  Do not bring valuables to the hospital. [facility] is not responsible for any missing/lost belongings or valuables.   Use CHG Soap or wipes as directed on instruction sheet.  Notify your doctor if there is any change in your medical condition (cold, fever, infection).  Wear comfortable clothing (specific to your surgery type) to the hospital.  Plan for stool softeners for home use; pain medications have a tendency to cause constipation. You can also help prevent constipation by eating foods high in fiber such as fruits and vegetables and drinking plenty of fluids as your diet allows.  After surgery, you can help prevent lung complications by doing breathing exercises.  Take deep breaths and cough every 1-2 hours. Your doctor may  order a device called an Incentive Spirometer to help you take deep breaths. When coughing or sneezing, hold a pillow firmly against your incision with both hands. This is called "splinting." Doing this helps protect your incision. It also decreases belly discomfort.  If you are being admitted to the hospital overnight, leave your suitcase in the car. After surgery  it may be brought to your room.  If you are being discharged the day of surgery, you will not be allowed to drive home. You will need a responsible adult (18 years or older) to drive you home and stay with you that night.   If you are taking public transportation, you will need to have a responsible adult (18 years or older) with you. Please confirm with your physician that it is acceptable to use public transportation.   Please call the [facility]. at [phone] if you have any questions about these instructions.  Visitation Policy:  Patients undergoing a surgery or procedure may have one family member or support person with them as long as that person is not COVID-19 positive or experiencing its symptoms.  That person may remain in the waiting area during the procedure.  Inpatient Visitation Update:   In an effort to ensure the safety of our team members and our patients, we are implementing a change to our visitation policy:  Effective Monday, Aug. 9, at 7 a.m., inpatients will be allowed one support person.  o The support person may change daily.  o The support person must pass our screening, gel in and out, and wear a mask at all times, including in the patient's room.  o Patients must also wear a mask when staff or their support person are in the room.  o Masking is required regardless of vaccination status.  Systemwide, no visitors 17 or younger.

## 2020-05-12 NOTE — Progress Notes (Signed)
  [facility] Perioperative Services: Pre-Admission/Anesthesia Testing     Date: 05/12/20  Name: Jacqueline Wong MRN:   [id number]  Re: Difficult venous access   Case: [id number] Date/Time: 05/18/20 0900   Procedure: ANTERIOR CERVICAL DECOMPRESSION/DISCECTOMY FUSION 2 LEVELS C6-T1 (N/A ) - please schedule as 2nd case of the day   Anesthesia type: General   Pre-op diagnosis: m54.12 cervical radiculopathy   Location: [place] 03 / [place] ORS FOR ANESTHESIA GROUP   Surgeons: Meade Maw, MD    Patient scheduled for the above procedure on 05/18/2020 with Dr. Meade Maw.  In preparation for her procedure, patient presented to the PAT clinic on 05/12/2020 for preoperative labs.  Multiple attempts were made to obtain MD ordered lab specimens.  Multidisciplinary resources utilized (PAT Quarry manager, main lab tech, PAT RN, and PAT APP), however adequate samples were unable to be obtained.  In efforts to reduce further discomfort for the patient, patient was advised that lab specimens could be performed on the day of surgery.  Order placed for IV team consult for PIV placement and lab draw on the day of surgery.  Notation made on patient's shadow chart for the OR to alert SDS staff.  Honor Loh, MSN, APRN, FNP-C, CEN [facility]  Peri-operative Services Nurse Practitioner Phone: [phone] 05/12/20 11:17 AM

## 2020-05-16 ENCOUNTER — Other Ambulatory Visit
Admission: RE | Admit: 2020-05-16 | Discharge: 2020-05-16 | Disposition: A | Discharge status: Home / Self Care | Payer: 59 | Source: Ambulatory Visit | Attending: Neurosurgery | Admitting: Neurosurgery

## 2020-05-16 ENCOUNTER — Other Ambulatory Visit: Payer: Self-pay

## 2020-05-16 DIAGNOSIS — Z01812 Encounter for preprocedural laboratory examination: Secondary | ICD-10-CM | POA: Insufficient documentation

## 2020-05-16 DIAGNOSIS — Z20822 Contact with and (suspected) exposure to covid-19: Secondary | ICD-10-CM | POA: Diagnosis not present

## 2020-05-16 LAB — SARS CORONAVIRUS 2 (TAT 6-24 HRS): SARS Coronavirus 2: NEGATIVE

## 2020-05-17 NOTE — Consult Note (Signed)
Pharmacy Antibiotic Note  Jacqueline Wong is a 49 y.o. female scheduled for anterior cervical decompression / discectomy fusion 2 levels C6-T1 on 05/18/20. Pharmacy has been consulted for cefazolin dosing for surgical prophylaxis.  Plan: Cefazolin 2 g IV x 1 to be administered within 60 minutes of surgical incision  Order is signed and held. Release when appropriate  No Known Allergies  Thank you for allowing pharmacy to be a part of this patient's care.  Benita Gutter 05/17/2020 2:50 PM

## 2020-05-18 ENCOUNTER — Ambulatory Visit
Admission: RE | Admit: 2020-05-18 | Discharge: 2020-05-18 | Disposition: A | Discharge status: Home / Self Care | Payer: 59 | Attending: Neurosurgery | Admitting: Neurosurgery

## 2020-05-18 ENCOUNTER — Ambulatory Visit: Payer: 59 | Admitting: Anesthesiology

## 2020-05-18 ENCOUNTER — Encounter
Admission: RE | Disposition: A | Discharge status: Home / Self Care | Payer: Self-pay | Source: Home / Self Care | Attending: Neurosurgery

## 2020-05-18 ENCOUNTER — Ambulatory Visit: Payer: 59

## 2020-05-18 ENCOUNTER — Other Ambulatory Visit: Payer: Self-pay

## 2020-05-18 ENCOUNTER — Ambulatory Visit: Payer: 59 | Admitting: Urgent Care

## 2020-05-18 ENCOUNTER — Encounter: Payer: Self-pay | Admitting: Neurosurgery

## 2020-05-18 DIAGNOSIS — Z79899 Other long term (current) drug therapy: Secondary | ICD-10-CM | POA: Diagnosis not present

## 2020-05-18 DIAGNOSIS — Z419 Encounter for procedure for purposes other than remedying health state, unspecified: Secondary | ICD-10-CM

## 2020-05-18 DIAGNOSIS — M5412 Radiculopathy, cervical region: Secondary | ICD-10-CM | POA: Insufficient documentation

## 2020-05-18 DIAGNOSIS — M2578 Osteophyte, vertebrae: Secondary | ICD-10-CM | POA: Diagnosis not present

## 2020-05-18 DIAGNOSIS — Z981 Arthrodesis status: Secondary | ICD-10-CM

## 2020-05-18 HISTORY — PX: ANTERIOR CERVICAL DECOMP/DISCECTOMY FUSION: SHX1161

## 2020-05-18 LAB — APTT: aPTT: 29 seconds (ref 24–36)

## 2020-05-18 LAB — BASIC METABOLIC PANEL
Anion gap: 10 (ref 5–15)
BUN: 10 mg/dL (ref 6–20)
CO2: 24 mmol/L (ref 22–32)
Calcium: 9.2 mg/dL (ref 8.9–10.3)
Chloride: 106 mmol/L (ref 98–111)
Creatinine, Ser: 0.62 mg/dL (ref 0.44–1.00)
GFR, Estimated: >60 mL/min (ref >60)
Glucose, Bld: 95 mg/dL (ref 70–99)
Potassium: 4.1 mmol/L (ref 3.5–5.1)
Sodium: 140 mmol/L (ref 135–145)

## 2020-05-18 LAB — CBC
HCT: 42.1 % (ref 36.0–46.0)
Hemoglobin: 13.9 g/dL (ref 12.0–15.0)
MCH: 31.9 pg (ref 26.0–34.0)
MCHC: 33 g/dL (ref 30.0–36.0)
MCV: 96.6 fL (ref 80.0–100.0)
Platelets: 213 10*3/uL (ref 150–400)
RBC: 4.36 MIL/uL (ref 3.87–5.11)
RDW: 12.8 % (ref 11.5–15.5)
WBC: 6.4 10*3/uL (ref 4.0–10.5)
nRBC: 0 % (ref 0.0–0.2)

## 2020-05-18 LAB — TYPE AND SCREEN
ABO/RH(D): A POS
Antibody Screen: NEGATIVE

## 2020-05-18 LAB — ABO/RH: ABO/RH(D): A POS

## 2020-05-18 LAB — PROTIME-INR
INR: 0.9 (ref 0.8–1.2)
Prothrombin Time: 12.1 seconds (ref 11.4–15.2)

## 2020-05-18 SURGERY — ANTERIOR CERVICAL DECOMPRESSION/DISCECTOMY FUSION 2 LEVELS
Anesthesia: General | Site: Spine Cervical

## 2020-05-18 MED ORDER — REMIFENTANIL HCL 1 MG IV SOLR
INTRAVENOUS | Status: AC
  Filled 2020-05-18: qty 1000

## 2020-05-18 MED ORDER — SODIUM CHLORIDE 0.9 % IV SOLN
INTRAVENOUS | Status: DC | PRN
  Administered 2020-05-18: 40 ug/min via INTRAVENOUS

## 2020-05-18 MED ORDER — FENTANYL CITRATE (PF) 100 MCG/2ML IJ SOLN
INTRAMUSCULAR | Status: AC
  Filled 2020-05-18: qty 2

## 2020-05-18 MED ORDER — MIDAZOLAM HCL 2 MG/2ML IJ SOLN
INTRAMUSCULAR | Status: AC
  Filled 2020-05-18: qty 2

## 2020-05-18 MED ORDER — HYDROMORPHONE HCL 1 MG/ML IJ SOLN
INTRAMUSCULAR | Status: AC
  Administered 2020-05-18: 0.25 mg via INTRAVENOUS
  Filled 2020-05-18: qty 0.5

## 2020-05-18 MED ORDER — REMIFENTANIL HCL 1 MG IV SOLR
INTRAVENOUS | Status: DC | PRN
  Administered 2020-05-18: .05 ug/kg/min via INTRAVENOUS

## 2020-05-18 MED ORDER — CEFAZOLIN SODIUM-DEXTROSE 2-4 GM/100ML-% IV SOLN
INTRAVENOUS | Status: AC
  Filled 2020-05-18: qty 100

## 2020-05-18 MED ORDER — MIDAZOLAM HCL 2 MG/2ML IJ SOLN
INTRAMUSCULAR | Status: DC | PRN
  Administered 2020-05-18: 2 mg via INTRAVENOUS

## 2020-05-18 MED ORDER — SUCCINYLCHOLINE CHLORIDE 20 MG/ML IJ SOLN
INTRAMUSCULAR | Status: DC | PRN
  Administered 2020-05-18: 100 mg via INTRAVENOUS

## 2020-05-18 MED ORDER — PROMETHAZINE HCL 25 MG/ML IJ SOLN: 6.2500 mg | INTRAMUSCULAR | Status: DC | PRN

## 2020-05-18 MED ORDER — CHLORHEXIDINE GLUCONATE 0.12 % MT SOLN
OROMUCOSAL | Status: AC
  Administered 2020-05-18: 15 mL via OROMUCOSAL
  Filled 2020-05-18: qty 15

## 2020-05-18 MED ORDER — CELECOXIB 100 MG PO CAPS: 100.0000 mg | ORAL_CAPSULE | Freq: Two times a day (BID) | ORAL | 0 refills | Status: AC

## 2020-05-18 MED ORDER — PHENYLEPHRINE HCL (PRESSORS) 10 MG/ML IV SOLN
INTRAVENOUS | Status: DC | PRN
  Administered 2020-05-18 (×3): 100 ug via INTRAVENOUS

## 2020-05-18 MED ORDER — DEXAMETHASONE SODIUM PHOSPHATE 10 MG/ML IJ SOLN
INTRAMUSCULAR | Status: DC | PRN
  Administered 2020-05-18: 10 mg via INTRAVENOUS

## 2020-05-18 MED ORDER — CEFAZOLIN SODIUM-DEXTROSE 2-4 GM/100ML-% IV SOLN
2.0000 g | Freq: Once | INTRAVENOUS | Status: AC
  Administered 2020-05-18: 2 g via INTRAVENOUS

## 2020-05-18 MED ORDER — LACTATED RINGERS IV SOLN: INTRAVENOUS | Status: DC

## 2020-05-18 MED ORDER — ACETAMINOPHEN 500 MG PO TABS: 500.0000 mg | ORAL_TABLET | Freq: Four times a day (QID) | ORAL | 0 refills | Status: AC | PRN

## 2020-05-18 MED ORDER — FAMOTIDINE 20 MG PO TABS
ORAL_TABLET | ORAL | Status: AC
  Administered 2020-05-18: 20 mg via ORAL
  Filled 2020-05-18: qty 1

## 2020-05-18 MED ORDER — ACETAMINOPHEN 10 MG/ML IV SOLN
INTRAVENOUS | Status: AC
  Filled 2020-05-18: qty 100

## 2020-05-18 MED ORDER — OXYCODONE HCL 5 MG PO TABS: 5.0000 mg | ORAL_TABLET | Freq: Once | ORAL | Status: AC | PRN

## 2020-05-18 MED ORDER — LIDOCAINE HCL (CARDIAC) PF 100 MG/5ML IV SOSY
PREFILLED_SYRINGE | INTRAVENOUS | Status: DC | PRN
  Administered 2020-05-18: 40 mg via INTRAVENOUS

## 2020-05-18 MED ORDER — GLYCOPYRROLATE 0.2 MG/ML IJ SOLN
INTRAMUSCULAR | Status: DC | PRN
  Administered 2020-05-18 (×2): .2 mg via INTRAVENOUS

## 2020-05-18 MED ORDER — HYDROMORPHONE HCL 1 MG/ML IJ SOLN
0.2500 mg | INTRAMUSCULAR | Status: DC | PRN
  Administered 2020-05-18 (×2): 0.25 mg via INTRAVENOUS

## 2020-05-18 MED ORDER — PROPOFOL 10 MG/ML IV BOLUS
INTRAVENOUS | Status: AC
  Filled 2020-05-18: qty 20

## 2020-05-18 MED ORDER — EPHEDRINE SULFATE 50 MG/ML IJ SOLN
INTRAMUSCULAR | Status: AC
  Filled 2020-05-18: qty 1

## 2020-05-18 MED ORDER — THROMBIN 5000 UNITS EX SOLR
CUTANEOUS | Status: AC
Start: 2020-05-18 — End: ?
  Filled 2020-05-18: qty 10000

## 2020-05-18 MED ORDER — ACETAMINOPHEN 10 MG/ML IV SOLN
INTRAVENOUS | Status: DC | PRN
  Administered 2020-05-18: 1000 mg via INTRAVENOUS

## 2020-05-18 MED ORDER — BUPIVACAINE-EPINEPHRINE (PF) 0.5% -1:200000 IJ SOLN
INTRAMUSCULAR | Status: AC
  Filled 2020-05-18: qty 30

## 2020-05-18 MED ORDER — PROPOFOL 10 MG/ML IV BOLUS
INTRAVENOUS | Status: DC | PRN
  Administered 2020-05-18: 120 mg via INTRAVENOUS

## 2020-05-18 MED ORDER — FENTANYL CITRATE (PF) 100 MCG/2ML IJ SOLN
INTRAMUSCULAR | Status: DC | PRN
  Administered 2020-05-18: 50 ug via INTRAVENOUS

## 2020-05-18 MED ORDER — OXYCODONE HCL 5 MG PO TABS: 5.0000 mg | ORAL_TABLET | ORAL | 0 refills | Status: AC | PRN

## 2020-05-18 MED ORDER — FAMOTIDINE 20 MG PO TABS: 20.0000 mg | ORAL_TABLET | Freq: Once | ORAL | Status: AC

## 2020-05-18 MED ORDER — CHLORHEXIDINE GLUCONATE 0.12 % MT SOLN: 15.0000 mL | Freq: Once | OROMUCOSAL | Status: AC

## 2020-05-18 MED ORDER — OXYCODONE HCL 5 MG PO TABS
ORAL_TABLET | ORAL | Status: AC
  Administered 2020-05-18: 5 mg via ORAL
  Filled 2020-05-18: qty 1

## 2020-05-18 MED ORDER — FENTANYL CITRATE (PF) 250 MCG/5ML IJ SOLN
INTRAMUSCULAR | Status: AC
  Filled 2020-05-18: qty 5

## 2020-05-18 MED ORDER — HYDROMORPHONE HCL 1 MG/ML IJ SOLN
INTRAMUSCULAR | Status: AC
  Administered 2020-05-18: 0.25 mg via INTRAVENOUS
  Filled 2020-05-18: qty 1

## 2020-05-18 MED ORDER — OXYCODONE HCL 5 MG/5ML PO SOLN: 5.0000 mg | Freq: Once | ORAL | Status: AC | PRN

## 2020-05-18 MED ORDER — ORAL CARE MOUTH RINSE: 15.0000 mL | Freq: Once | OROMUCOSAL | Status: AC

## 2020-05-18 MED ORDER — FENTANYL CITRATE (PF) 100 MCG/2ML IJ SOLN
25.0000 ug | INTRAMUSCULAR | Status: DC | PRN
  Administered 2020-05-18: 25 ug via INTRAVENOUS
  Administered 2020-05-18 (×2): 50 ug via INTRAVENOUS
  Administered 2020-05-18: 25 ug via INTRAVENOUS

## 2020-05-18 MED ORDER — THROMBIN 5000 UNITS EX SOLR
CUTANEOUS | Status: DC | PRN
  Administered 2020-05-18: 5000 [IU] via TOPICAL

## 2020-05-18 MED ORDER — ONDANSETRON HCL 4 MG/2ML IJ SOLN
INTRAMUSCULAR | Status: DC | PRN
  Administered 2020-05-18: 4 mg via INTRAVENOUS

## 2020-05-18 MED ORDER — BUPIVACAINE-EPINEPHRINE (PF) 0.5% -1:200000 IJ SOLN
INTRAMUSCULAR | Status: DC | PRN
  Administered 2020-05-18: 5 mL

## 2020-05-18 SURGICAL SUPPLY — 60 items
ADH SKN CLS APL DERMABOND .7 (GAUZE/BANDAGES/DRESSINGS) ×1
AGENT HMST MTR 8 SURGIFLO (HEMOSTASIS) ×1
APL PRP STRL LF DISP 70% ISPRP (MISCELLANEOUS) ×2
BASKET BONE COLLECTION (BASKET) IMPLANT
BULB RESERV EVAC DRAIN JP 100C (MISCELLANEOUS) IMPLANT
BUR NEURO DRILL SOFT 3.0X3.8M (BURR) ×2 IMPLANT
CAGE CERV MOD 6X15X12 7D (Cage) ×4 IMPLANT
CHLORAPREP W/TINT 26 (MISCELLANEOUS) ×4 IMPLANT
COUNTER NEEDLE 20/40 LG (NEEDLE) ×2 IMPLANT
COVER WAND RF STERILE (DRAPES) ×2 IMPLANT
CUP MEDICINE 2OZ PLAST GRAD ST (MISCELLANEOUS) ×2 IMPLANT
DERMABOND ADVANCED (GAUZE/BANDAGES/DRESSINGS) ×1
DERMABOND ADVANCED .7 DNX12 (GAUZE/BANDAGES/DRESSINGS) ×1 IMPLANT
DRAIN CHANNEL JP 10F RND 20C F (MISCELLANEOUS) IMPLANT
DRAPE C ARM PK CFD 31 SPINE (DRAPES) ×4 IMPLANT
DRAPE LAPAROTOMY 77X122 PED (DRAPES) ×2 IMPLANT
DRAPE MICROSCOPE SPINE 48X150 (DRAPES) ×2 IMPLANT
DRAPE SURG 17X11 SM STRL (DRAPES) ×8 IMPLANT
ELECT CAUTERY BLADE TIP 2.5 (TIP) ×2
ELECT REM PT RETURN 9FT ADLT (ELECTROSURGICAL) ×2
ELECTRODE CAUTERY BLDE TIP 2.5 (TIP) ×1 IMPLANT
ELECTRODE REM PT RTRN 9FT ADLT (ELECTROSURGICAL) ×1 IMPLANT
FEE INTRAOP MONITOR IMPULS NCS (MISCELLANEOUS) IMPLANT
GLOVE BIOGEL PI IND STRL 7.0 (GLOVE) ×1 IMPLANT
GLOVE BIOGEL PI INDICATOR 7.0 (GLOVE) ×1
GLOVE SURG SYN 7.0 (GLOVE) ×4 IMPLANT
GLOVE SURG SYN 8.5  E (GLOVE) ×3
GLOVE SURG SYN 8.5 E (GLOVE) ×3 IMPLANT
GOWN SRG XL LVL 3 NONREINFORCE (GOWNS) ×1 IMPLANT
GOWN STRL NON-REIN TWL XL LVL3 (GOWNS) ×2
GOWN STRL REUS W/ TWL XL LVL3 (GOWN DISPOSABLE) ×1 IMPLANT
GOWN STRL REUS W/TWL XL LVL3 (GOWN DISPOSABLE) ×2
GRADUATE 1200CC STRL 31836 (MISCELLANEOUS) ×2 IMPLANT
INTRAOP MONITOR FEE IMPULS NCS (MISCELLANEOUS)
INTRAOP MONITOR FEE IMPULSE (MISCELLANEOUS)
KIT TURNOVER KIT A (KITS) ×2 IMPLANT
MANIFOLD NEPTUNE II (INSTRUMENTS) ×2 IMPLANT
MARKER SKIN DUAL TIP RULER LAB (MISCELLANEOUS) ×4 IMPLANT
NDL SAFETY ECLIPSE 18X1.5 (NEEDLE) ×1 IMPLANT
NEEDLE HYPO 18GX1.5 SHARP (NEEDLE) ×2
NEEDLE HYPO 22GX1.5 SAFETY (NEEDLE) ×2 IMPLANT
NS IRRIG 1000ML POUR BTL (IV SOLUTION) ×2 IMPLANT
PACK LAMINECTOMY NEURO (CUSTOM PROCEDURE TRAY) ×2 IMPLANT
PAD ARMBOARD 7.5X6 YLW CONV (MISCELLANEOUS) ×2 IMPLANT
PIN CASPAR 14 (PIN) ×1 IMPLANT
PIN CASPAR 14MM (PIN) ×2
PLATE ACP 1.6X32 2LVL (Plate) ×2 IMPLANT
PUTTY DBM PROPEL MEDIUM (Putty) ×2 IMPLANT
SCREW ACP VA SD 3.5X15 (Screw) ×12 IMPLANT
SPOGE SURGIFLO 8M (HEMOSTASIS) ×1
SPONGE KITTNER 5P (MISCELLANEOUS) ×2 IMPLANT
SPONGE SURGIFLO 8M (HEMOSTASIS) ×1 IMPLANT
STAPLER SKIN PROX 35W (STAPLE) IMPLANT
SUT V-LOC 90 ABS DVC 3-0 CL (SUTURE) ×2 IMPLANT
SUT VIC AB 3-0 SH 8-18 (SUTURE) ×2 IMPLANT
SYR 30ML LL (SYRINGE) ×2 IMPLANT
TAPE CLOTH 3X10 WHT NS LF (GAUZE/BANDAGES/DRESSINGS) ×2 IMPLANT
TOWEL OR 17X26 4PK STRL BLUE (TOWEL DISPOSABLE) ×6 IMPLANT
TRAY FOLEY MTR SLVR 16FR STAT (SET/KITS/TRAYS/PACK) IMPLANT
TUBING CONNECTING 10 (TUBING) ×2 IMPLANT

## 2020-05-18 NOTE — Transfer of Care (Signed)
Immediate Anesthesia Transfer of Care Note  Patient: [name]  Procedure(s) Performed: ANTERIOR CERVICAL DECOMPRESSION/DISCECTOMY FUSION 2 LEVELS C6-T1 (N/A Spine Cervical)  Patient Location: PACU  Anesthesia Type:General  Level of Consciousness: awake, alert  and oriented  Airway & Oxygen Therapy: Patient Spontanous Breathing  Post-op Assessment: Report given to RN and Post -op Vital signs reviewed and stable  Post vital signs: Reviewed and stable  Last Vitals:  Vitals Value Taken Time  BP 127/75 05/18/20 0938  Temp    Pulse 108 05/18/20 0941  Resp 21 05/18/20 0941  SpO2 99 % 05/18/20 0941  Vitals shown include unvalidated device data.  Last Pain:  Vitals:   05/18/20 0614  TempSrc: Oral  PainSc: 5          Complications: No complications documented.

## 2020-05-18 NOTE — Discharge Summary (Signed)
Procedure: ACDF C6-T1 Procedure date: 05/18/2020 Diagnosis: cervical radiculopathy    History: Jacqueline Wong is s/p ACDF C6-T1.  POD0: Tolerated procedure well. Evaluated in post op recovery still disoriented from anesthesia but able to answer questions and obey commands.   Physical Exam: Vitals:   05/18/20 0614 05/18/20 0945  BP: 115/77   Pulse: 88   Resp: 14   Temp: 98.3 F (36.8 C) (!) 97 F (36.1 C)  SpO2: 99%     General: Alert and oriented, sitting in bed Strength:5/5 to RUE, b/l LE, still some weakness to left arm (preexisting), grip 5/5 to LUE, L biceps 4+/5, tricep 4+/5.  Sensation: intact and symmetric throughout  Skin: neck incision clean, dry, intact.   Data:  Recent Labs  Lab 05/18/20 0622  NA 140  K 4.1  CL 106  CO2 24  BUN 10  CREATININE 0.62  GLUCOSE 95  CALCIUM 9.2   No results for input(s): AST, ALT, ALKPHOS in the last 168 hours.  Invalid input(s): TBILI   Recent Labs  Lab 05/18/20 0622  WBC 6.4  HGB 13.9  HCT 42.1  PLT 213   Recent Labs  Lab 05/18/20 0622  APTT 29  INR 0.9         Assessment/Plan:  Jacqueline Wong is POD0 s/p C6-T1. She must remain in PACU for 4 hours post op, be able to tolerate some PO, ambulate, and urinate prior to d/c.   She will f/u in clinic in two weeks with me.   Lonell Face, NP Department of Neurosurgery

## 2020-05-18 NOTE — Op Note (Signed)
Indications: Ms. Kuck is a 49 yo female who presented with cervical radiculopathy.  She tried and failed conservative management, elected for surgical intervention.  Findings: severe neuroforaminal stenosis at both levels  Preoperative Diagnosis: Cervical radiculopathy Postoperative Diagnosis: same   EBL: 50 ml IVF: 600 ml Drains: none Disposition: Extubated and Stable to PACU Complications: none  No foley catheter was placed.   Preoperative Note:   Risks of surgery discussed include: infection, bleeding, stroke, coma, death, paralysis, CSF leak, nerve/spinal cord injury, numbness, tingling, weakness, complex regional pain syndrome, recurrent stenosis and/or disc herniation, vascular injury, development of instability, neck/back pain, need for further surgery, persistent symptoms, development of deformity, and the risks of anesthesia. The patient understood these risks and agreed to proceed.  Operative Note:   Procedure:  1) Anterior cervical diskectomy and fusion at C6/7 and C7/T1 2) Anterior cervical instrumentation at C6 - T1 using Nuvasive ACP 3) Placement of biomechanical devices at C6/7 and C7/T1  4) Use of operative microscope 5) Use of flouroscopy   Procedure: After obtaining informed consent, the patient taken to the operating room, placed in supine position, general anesthesia induced.  The patient had a small shoulder roll placed behind their shoulders.  The patient received preop antibiotics and IV Decadron.  The patient had a neck incision outlined, was prepped and draped in usual sterile fashion. The incision was injected with local anesthetic.   An incision was opened, dissection taken down medial to the carotid artery and jugular vein, lateral to the trachea and esophagus.  The prevertebral fascia identified and a localizing x-ray demonstrated the correct level.  The longus colli were dissected laterally, and self-retaining retractors placed to open the operative  field. The microscope was then brought into the field.  With this complete, distractor pins were placed in the vertebral bodies of C6 and T1. The distractor was placed, and the annuli at C6/7 and C7/T1 were opened using a bovie.  Curettes and pituitary rongeurs used to remove the majority of disk, then the drill was used to remove the posterior osteophyte and begin the foraminotomies. The nerve hook was used to elevate the posterior longitudinal ligament, which was then removed with Kerrison rongeurs. The microblunt nerve hook could be passed out the foramen bilateral at each level.   Meticulous hemostasis obtained.  A biomechanical device (Nuvasive Modulus 6 mm height x 14 mm width by 12 mm depth) was placed at C6/7. A second biomechanical device (Nuvasive Modulus 6 mm height x 14 mm width by 12 mm depth) was placed at C7/T1. Each device had been filled with allograft for aid in arthrodesis.  The caspar distractor was removed, and bone wax used for hemostasis. A 34 mm Nuvasive ACP plate was chosen.  Two screws placed in each vertebral body, respectively making sure the screws were behind the locking mechanism.  Final AP and lateral radiographs were taken.   With everything in good position, the wound was irrigated copiously with bacitracin-containing solution and meticulous hemostasis obtained.  Wound was closed in 2 layers using interrupted inverted 3-0 Vicryl sutures in the platysma and 3-0 monocryl on the dermis.  The wound was dressed with dermabond, the head of bed at 30 degrees, taken to recovery room in stable condition.  No new postop neurological deficits were identified.  Sponge and pattie counts were correct at the end of the procedure.     I performed the entire procedure with the assistance of Lonell Face NP as an Pensions consultant.  [employer]  Myer Haff MD

## 2020-05-18 NOTE — Anesthesia Procedure Notes (Signed)
Procedure Name: Intubation Performed by: Fredderick Phenix, CRNA Pre-anesthesia Checklist: Patient identified, Emergency Drugs available, Suction available and Patient being monitored Patient Re-evaluated:Patient Re-evaluated prior to induction Oxygen Delivery Method: Circle system utilized Preoxygenation: Pre-oxygenation with 100% oxygen Induction Type: IV induction Ventilation: Mask ventilation without difficulty Laryngoscope Size: McGraph (electively intubated with video laryngoscope) Grade View: Grade II Tube type: Oral Tube size: 7.0 mm Number of attempts: 1 Airway Equipment and Method: Stylet and Oral airway Placement Confirmation: ETT inserted through vocal cords under direct vision,  positive ETCO2 and breath sounds checked- equal and bilateral Secured at: 21 cm Tube secured with: Tape Dental Injury: Teeth and Oropharynx as per pre-operative assessment

## 2020-05-18 NOTE — Discharge Instructions (Signed)

## 2020-05-18 NOTE — Anesthesia Preprocedure Evaluation (Addendum)
Anesthesia Evaluation  Patient identified by MRN, date of birth, ID band Patient awake    Reviewed: Allergy & Precautions, H&P , NPO status , Patient's Chart, lab work & pertinent test results  History of Anesthesia Complications Negative for: history of anesthetic complications  Airway Mallampati: II  TM Distance: >3 FB Neck ROM: full    Dental  (+) Teeth Intact   Pulmonary neg pulmonary ROS, neg sleep apnea, neg COPD,    breath sounds clear to auscultation       Cardiovascular (-) angina(-) Past MI and (-) Cardiac Stents negative cardio ROS  (-) dysrhythmias  Rhythm:regular Rate:Normal     Neuro/Psych  Headaches, PSYCHIATRIC DISORDERS Anxiety Cervical radiculopathy     GI/Hepatic negative GI ROS, Neg liver ROS,   Endo/Other  negative endocrine ROS  Renal/GU Kidney stones     Musculoskeletal   Abdominal   Peds  Hematology negative hematology ROS (+)   Anesthesia Other Findings Past Medical History: No date: Allergy No date: Anxiety No date: Arthritis No date: Heart murmur No date: History of kidney stones No date: Kidney stone 1987: Kidney stones No date: Migraine No date: Scoliosis No date: Teeth grinding  Past Surgical History: No date: CESAREAN SECTION     Comment:  x 2 No date: EYE SURGERY     Comment:  EYE LIDS No date: TUBAL LIGATION No date: WISDOM TOOTH EXTRACTION     Reproductive/Obstetrics negative OB ROS                            Anesthesia Physical Anesthesia Plan  ASA: II  Anesthesia Plan: General ETT   Post-op Pain Management:    Induction:   PONV Risk Score and Plan: Ondansetron, Dexamethasone, Midazolam and Treatment may vary due to age or medical condition  Airway Management Planned:   Additional Equipment:   Intra-op Plan:   Post-operative Plan:   Informed Consent: I have reviewed the patients History and Physical, chart, labs and  discussed the procedure including the risks, benefits and alternatives for the proposed anesthesia with the patient or authorized representative who has indicated his/her understanding and acceptance.     Dental Advisory Given  Plan Discussed with: Anesthesiologist, CRNA and Surgeon  Anesthesia Plan Comments:         Anesthesia Quick Evaluation

## 2020-05-18 NOTE — Anesthesia Postprocedure Evaluation (Signed)
Anesthesia Post Note  Patient: Jacqueline Wong  Procedure(s) Performed: ANTERIOR CERVICAL DECOMPRESSION/DISCECTOMY FUSION 2 LEVELS C6-T1 (N/A Spine Cervical)  Patient location during evaluation: PACU Anesthesia Type: General Level of consciousness: awake and alert Pain management: pain level controlled Vital Signs Assessment: post-procedure vital signs reviewed and stable Respiratory status: spontaneous breathing, nonlabored ventilation and respiratory function stable Cardiovascular status: blood pressure returned to baseline and stable Postop Assessment: no apparent nausea or vomiting Anesthetic complications: no   No complications documented.   Last Vitals:  Vitals:   05/18/20 1408 05/18/20 1413  BP: 112/74 108/73  Pulse: 82 86  Resp:    Temp:    SpO2: 99% 99%    Last Pain:  Vitals:   05/18/20 1413  TempSrc:   PainSc: [place]

## 2020-05-18 NOTE — H&P (Signed)
I have reviewed and confirmed my history and physical from 04/28/2020 with no additions or changes. Plan for C6-T1 ACDF.  Risks and benefits reviewed.  Heart sounds normal no MRG. Chest Clear to Auscultation Bilaterally.

## 2020-05-19 ENCOUNTER — Encounter: Payer: Self-pay | Admitting: Neurosurgery

## 2020-06-10 ENCOUNTER — Other Ambulatory Visit: Payer: Self-pay | Admitting: Nurse Practitioner

## 2020-06-10 DIAGNOSIS — M5412 Radiculopathy, cervical region: Secondary | ICD-10-CM

## 2020-06-12 ENCOUNTER — Ambulatory Visit
Admission: RE | Admit: 2020-06-12 | Discharge: 2020-06-12 | Disposition: A | Discharge status: Home / Self Care | Payer: 59 | Source: Ambulatory Visit | Attending: Nurse Practitioner | Admitting: Nurse Practitioner

## 2020-06-12 ENCOUNTER — Other Ambulatory Visit: Payer: Self-pay

## 2020-06-12 DIAGNOSIS — M5412 Radiculopathy, cervical region: Secondary | ICD-10-CM | POA: Diagnosis not present

## 2020-06-14 ENCOUNTER — Ambulatory Visit: Payer: 59

## 2020-06-15 ENCOUNTER — Other Ambulatory Visit: Payer: Self-pay | Admitting: Neurosurgery

## 2020-06-20 ENCOUNTER — Encounter
Admission: RE | Admit: 2020-06-20 | Discharge: 2020-06-20 | Disposition: A | Discharge status: Home / Self Care | Payer: 59 | Source: Ambulatory Visit | Attending: Neurosurgery | Admitting: Neurosurgery

## 2020-06-20 ENCOUNTER — Other Ambulatory Visit
Admission: RE | Admit: 2020-06-20 | Discharge: 2020-06-20 | Disposition: A | Discharge status: Home / Self Care | Payer: 59 | Source: Ambulatory Visit | Attending: Neurosurgery | Admitting: Neurosurgery

## 2020-06-20 ENCOUNTER — Other Ambulatory Visit: Payer: Self-pay

## 2020-06-20 DIAGNOSIS — Z20822 Contact with and (suspected) exposure to covid-19: Secondary | ICD-10-CM | POA: Diagnosis not present

## 2020-06-20 DIAGNOSIS — Z01812 Encounter for preprocedural laboratory examination: Secondary | ICD-10-CM | POA: Insufficient documentation

## 2020-06-20 LAB — SARS CORONAVIRUS 2 (TAT 6-24 HRS): SARS Coronavirus 2: NEGATIVE

## 2020-06-20 LAB — SURGICAL PCR SCREEN
MRSA, PCR: NEGATIVE
Staphylococcus aureus: NEGATIVE

## 2020-06-20 NOTE — Progress Notes (Signed)
Came for drive through covid test, instructed that she needed to go inside for additional labs. Patient stated that she was not going to go inside for labs, that she would just get them done on the day of surgery.

## 2020-06-20 NOTE — Patient Instructions (Signed)
Your procedure is scheduled on: June 22, 2020 Wednesday  Report to the Registration Desk on the 1st floor of the [facility]. To find out your arrival time, please call [phone] between 1PM - 3PM on: Tuesday June 21, 2020  REMEMBER: Instructions that are not followed completely may result in serious medical risk, up to and including death; or upon the discretion of your surgeon and anesthesiologist your surgery may need to be rescheduled.  Do not eat food after midnight the night before surgery.  No gum chewing, lozengers or hard candies.  You may however, drink CLEAR liquids up to 2 hours before you are scheduled to arrive for your surgery. Do not drink anything within 2 hours of your scheduled arrival time.  Clear liquids include: - water  - apple juice without pulp - gatorade (not RED, PURPLE, OR BLUE) - black coffee or tea (Do NOT add milk or creamers to the coffee or tea) Do NOT drink anything that is not on this list.  Type 1 and Type 2 diabetics should only drink water.    TAKE THESE MEDICATIONS THE MORNING OF SURGERY WITH A SIP OF WATER: GABAPENTIN  PAIN PILL IF NEEDED  One week prior to surgery: Stop Anti-inflammatories (NSAIDS) such as Advil, Aleve, Ibuprofen, Motrin, Naproxen, Naprosyn and ASPIRIN Aspirin based products such as Excedrin, Goodys Powder, BC Powder. STOP CELEBREX  Stop ANY OVER THE COUNTER supplements until after surgery. (However, you may continue taking Vitamin D, Vitamin B, and multivitamin up until the day before surgery.)  No Alcohol for 24 hours before or after surgery.  No Smoking including e-cigarettes for 24 hours prior to surgery.  No chewable tobacco products for at least 6 hours prior to surgery.  No nicotine patches on the day of surgery.  Do not use any "recreational" drugs for at least a week prior to your surgery.  Please be advised that the combination of cocaine and anesthesia may have negative outcomes, up to and  including death. If you test positive for cocaine, your surgery will be cancelled.  On the morning of surgery brush your teeth with toothpaste and water, you may rinse your mouth with mouthwash if you wish. Do not swallow any toothpaste or mouthwash.  Do not wear jewelry, make-up, hairpins, clips or nail polish.  Do not wear lotions, powders, or perfume AND DEODORANT  Do not shave body from the neck down 48 hours prior to surgery just in case you cut yourself which could leave a site for infection.  Also, freshly shaved skin may become irritated if using the CHG soap.  Contact lenses, hearing aids and dentures may not be worn into surgery.  Do not bring valuables to the hospital. [facility] is not responsible for any missing/lost belongings or valuables.   Use CHG Soap  as directed on instruction sheet.  Notify your doctor if there is any change in your medical condition (cold, fever, infection).  Wear comfortable clothing (specific to your surgery type) to the hospital.  Plan for stool softeners for home use; pain medications have a tendency to cause constipation. You can also help prevent constipation by eating foods high in fiber such as fruits and vegetables and drinking plenty of fluids as your diet allows.  After surgery, you can help prevent lung complications by doing breathing exercises.  Take deep breaths and cough every 1-2 hours. Your doctor may order a device called an Incentive Spirometer to help you take deep breaths. When coughing or sneezing,  hold a pillow firmly against your incision with both hands. This is called "splinting." Doing this helps protect your incision. It also decreases belly discomfort.  If you are being discharged the day of surgery, you will not be allowed to drive home. You will need a responsible adult (18 years or older) to drive you home and stay with you that night.   Please call the [facility] Dept. at [phone] if you have  any questions about these instructions.  Visitation Policy:  Patients undergoing a surgery or procedure may have one family member or support person with them as long as that person is not COVID-19 positive or experiencing its symptoms.  That person may remain in the waiting area during the procedure.  Inpatient Visitation:    Visiting hours are 7 a.m. to 8 p.m. Patients will be allowed one visitor. The visitor may change daily. The visitor must pass COVID-19 screenings, use hand sanitizer when entering and exiting the patient's room and wear a mask at all times, including in the patient's room. Patients must also wear a mask when staff or their visitor are in the room. Masking is required regardless of vaccination status. Systemwide, no visitors 17 or younger.

## 2020-06-22 ENCOUNTER — Encounter
Admission: RE | Disposition: A | Discharge status: Home / Self Care | Payer: Self-pay | Source: Home / Self Care | Attending: Neurosurgery

## 2020-06-22 ENCOUNTER — Ambulatory Visit: Payer: 59 | Admitting: Anesthesiology

## 2020-06-22 ENCOUNTER — Other Ambulatory Visit: Payer: Self-pay

## 2020-06-22 ENCOUNTER — Ambulatory Visit: Payer: 59

## 2020-06-22 ENCOUNTER — Ambulatory Visit
Admission: RE | Admit: 2020-06-22 | Discharge: 2020-06-22 | Disposition: A | Discharge status: Home / Self Care | Payer: 59 | Attending: Neurosurgery | Admitting: Neurosurgery

## 2020-06-22 ENCOUNTER — Encounter: Payer: Self-pay | Admitting: Neurosurgery

## 2020-06-22 DIAGNOSIS — M48061 Spinal stenosis, lumbar region without neurogenic claudication: Secondary | ICD-10-CM | POA: Insufficient documentation

## 2020-06-22 DIAGNOSIS — M5412 Radiculopathy, cervical region: Secondary | ICD-10-CM | POA: Diagnosis not present

## 2020-06-22 DIAGNOSIS — M4802 Spinal stenosis, cervical region: Secondary | ICD-10-CM | POA: Diagnosis not present

## 2020-06-22 DIAGNOSIS — Z791 Long term (current) use of non-steroidal anti-inflammatories (NSAID): Secondary | ICD-10-CM | POA: Diagnosis not present

## 2020-06-22 DIAGNOSIS — Z419 Encounter for procedure for purposes other than remedying health state, unspecified: Secondary | ICD-10-CM

## 2020-06-22 DIAGNOSIS — Z79899 Other long term (current) drug therapy: Secondary | ICD-10-CM | POA: Diagnosis not present

## 2020-06-22 DIAGNOSIS — M5127 Other intervertebral disc displacement, lumbosacral region: Secondary | ICD-10-CM | POA: Insufficient documentation

## 2020-06-22 DIAGNOSIS — M5126 Other intervertebral disc displacement, lumbar region: Secondary | ICD-10-CM | POA: Insufficient documentation

## 2020-06-22 HISTORY — PX: POSTERIOR CERVICAL LAMINECTOMY: SHX2248

## 2020-06-22 LAB — TYPE AND SCREEN
ABO/RH(D): A POS
Antibody Screen: NEGATIVE

## 2020-06-22 SURGERY — POSTERIOR CERVICAL LAMINECTOMY
Anesthesia: General | Laterality: Right

## 2020-06-22 MED ORDER — GABAPENTIN 300 MG PO CAPS: 600.0000 mg | ORAL_CAPSULE | Freq: Once | ORAL | Status: AC

## 2020-06-22 MED ORDER — FENTANYL CITRATE (PF) 100 MCG/2ML IJ SOLN
INTRAMUSCULAR | Status: AC
  Administered 2020-06-22: 50 ug via INTRAVENOUS
  Filled 2020-06-22: qty 2

## 2020-06-22 MED ORDER — FAMOTIDINE 20 MG PO TABS
20.0000 mg | ORAL_TABLET | Freq: Once | ORAL | Status: AC
  Administered 2020-06-22: 20 mg via ORAL

## 2020-06-22 MED ORDER — ONDANSETRON HCL 4 MG/2ML IJ SOLN
INTRAMUSCULAR | Status: AC
  Filled 2020-06-22: qty 4

## 2020-06-22 MED ORDER — EPHEDRINE SULFATE 50 MG/ML IJ SOLN
INTRAMUSCULAR | Status: DC | PRN
  Administered 2020-06-22 (×5): 5 mg via INTRAVENOUS

## 2020-06-22 MED ORDER — REMIFENTANIL HCL 1 MG IV SOLR
INTRAVENOUS | Status: AC
  Filled 2020-06-22: qty 1000

## 2020-06-22 MED ORDER — FENTANYL CITRATE (PF) 100 MCG/2ML IJ SOLN
INTRAMUSCULAR | Status: AC
  Filled 2020-06-22: qty 2

## 2020-06-22 MED ORDER — OXYCODONE HCL 5 MG PO TABS
5.0000 mg | ORAL_TABLET | Freq: Once | ORAL | Status: AC | PRN
  Administered 2020-06-22: 5 mg via ORAL

## 2020-06-22 MED ORDER — GABAPENTIN 300 MG PO CAPS
ORAL_CAPSULE | ORAL | Status: AC
  Administered 2020-06-22: 600 mg via ORAL
  Filled 2020-06-22: qty 2

## 2020-06-22 MED ORDER — DEXMEDETOMIDINE (PRECEDEX) IN NS 20 MCG/5ML (4 MCG/ML) IV SYRINGE
PREFILLED_SYRINGE | INTRAVENOUS | Status: DC | PRN
  Administered 2020-06-22: 4 ug via INTRAVENOUS
  Administered 2020-06-22: 12 ug via INTRAVENOUS

## 2020-06-22 MED ORDER — CEFAZOLIN SODIUM-DEXTROSE 2-4 GM/100ML-% IV SOLN
INTRAVENOUS | Status: AC
  Filled 2020-06-22: qty 100

## 2020-06-22 MED ORDER — FAMOTIDINE 20 MG PO TABS
ORAL_TABLET | ORAL | Status: AC
  Filled 2020-06-22: qty 1

## 2020-06-22 MED ORDER — CEFAZOLIN SODIUM-DEXTROSE 2-4 GM/100ML-% IV SOLN
2.0000 g | INTRAVENOUS | Status: AC
  Administered 2020-06-22: 2 g via INTRAVENOUS

## 2020-06-22 MED ORDER — DEXAMETHASONE SODIUM PHOSPHATE 10 MG/ML IJ SOLN
INTRAMUSCULAR | Status: DC | PRN
  Administered 2020-06-22: 10 mg via INTRAVENOUS

## 2020-06-22 MED ORDER — FENTANYL CITRATE (PF) 100 MCG/2ML IJ SOLN
25.0000 ug | INTRAMUSCULAR | Status: DC | PRN
  Administered 2020-06-22 (×2): 50 ug via INTRAVENOUS

## 2020-06-22 MED ORDER — ORAL CARE MOUTH RINSE: 15.0000 mL | Freq: Once | OROMUCOSAL | Status: AC

## 2020-06-22 MED ORDER — SODIUM CHLORIDE 0.9 % IV SOLN
INTRAVENOUS | Status: DC | PRN
  Administered 2020-06-22: 40 mL

## 2020-06-22 MED ORDER — ONDANSETRON HCL 4 MG/2ML IJ SOLN
INTRAMUSCULAR | Status: DC | PRN
  Administered 2020-06-22: 4 mg via INTRAVENOUS

## 2020-06-22 MED ORDER — REMIFENTANIL HCL 1 MG IV SOLR
INTRAVENOUS | Status: DC | PRN
  Administered 2020-06-22: .1 ug/kg/min via INTRAVENOUS

## 2020-06-22 MED ORDER — LACTATED RINGERS IV SOLN: INTRAVENOUS | Status: DC

## 2020-06-22 MED ORDER — HYDROMORPHONE HCL 1 MG/ML IJ SOLN
0.5000 mg | INTRAMUSCULAR | Status: AC | PRN
Start: 2020-06-22 — End: 2020-06-22
  Administered 2020-06-22 (×2): 0.5 mg via INTRAVENOUS

## 2020-06-22 MED ORDER — MIDAZOLAM HCL 2 MG/2ML IJ SOLN
INTRAMUSCULAR | Status: DC | PRN
  Administered 2020-06-22: 2 mg via INTRAVENOUS

## 2020-06-22 MED ORDER — METHYLPREDNISOLONE ACETATE 40 MG/ML IJ SUSP
INTRAMUSCULAR | Status: DC | PRN
  Administered 2020-06-22: 40 mg

## 2020-06-22 MED ORDER — BUPIVACAINE-EPINEPHRINE (PF) 0.5% -1:200000 IJ SOLN
INTRAMUSCULAR | Status: DC | PRN
  Administered 2020-06-22: 4 mL

## 2020-06-22 MED ORDER — OXYCODONE HCL 5 MG/5ML PO SOLN: 5.0000 mg | Freq: Once | ORAL | Status: AC | PRN

## 2020-06-22 MED ORDER — GLYCOPYRROLATE 0.2 MG/ML IJ SOLN
INTRAMUSCULAR | Status: DC | PRN
  Administered 2020-06-22: .2 mg via INTRAVENOUS

## 2020-06-22 MED ORDER — CHLORHEXIDINE GLUCONATE 0.12 % MT SOLN
OROMUCOSAL | Status: AC
  Administered 2020-06-22: 15 mL via OROMUCOSAL
  Filled 2020-06-22: qty 15

## 2020-06-22 MED ORDER — CYCLOBENZAPRINE HCL 10 MG PO TABS
10.0000 mg | ORAL_TABLET | Freq: Once | ORAL | Status: AC
  Administered 2020-06-22: 10 mg via ORAL
  Filled 2020-06-22: qty 1

## 2020-06-22 MED ORDER — SUCCINYLCHOLINE CHLORIDE 200 MG/10ML IV SOSY
PREFILLED_SYRINGE | INTRAVENOUS | Status: AC
  Filled 2020-06-22: qty 10

## 2020-06-22 MED ORDER — OXYCODONE HCL 5 MG PO TABS
ORAL_TABLET | ORAL | Status: AC
  Filled 2020-06-22: qty 1

## 2020-06-22 MED ORDER — ACETAMINOPHEN 10 MG/ML IV SOLN
INTRAVENOUS | Status: DC | PRN
  Administered 2020-06-22: 1000 mg via INTRAVENOUS

## 2020-06-22 MED ORDER — FENTANYL CITRATE (PF) 100 MCG/2ML IJ SOLN
INTRAMUSCULAR | Status: DC | PRN
  Administered 2020-06-22: 100 ug via INTRAVENOUS
  Administered 2020-06-22 (×2): 50 ug via INTRAVENOUS

## 2020-06-22 MED ORDER — SODIUM CHLORIDE 0.9 % IV SOLN
INTRAVENOUS | Status: DC | PRN
  Administered 2020-06-22: 25 ug/min via INTRAVENOUS

## 2020-06-22 MED ORDER — HYDROMORPHONE HCL 1 MG/ML IJ SOLN
INTRAMUSCULAR | Status: AC
  Administered 2020-06-22: 0.5 mg via INTRAVENOUS
  Filled 2020-06-22: qty 1

## 2020-06-22 MED ORDER — ACETAMINOPHEN 10 MG/ML IV SOLN
INTRAVENOUS | Status: AC
  Filled 2020-06-22: qty 100

## 2020-06-22 MED ORDER — SUCCINYLCHOLINE CHLORIDE 20 MG/ML IJ SOLN
INTRAMUSCULAR | Status: DC | PRN
  Administered 2020-06-22: 100 mg via INTRAVENOUS

## 2020-06-22 MED ORDER — PROPOFOL 10 MG/ML IV BOLUS
INTRAVENOUS | Status: DC | PRN
  Administered 2020-06-22: 100 mg via INTRAVENOUS

## 2020-06-22 MED ORDER — THROMBIN 5000 UNITS EX SOLR
CUTANEOUS | Status: DC | PRN
  Administered 2020-06-22: 5000 [IU] via TOPICAL

## 2020-06-22 MED ORDER — CHLORHEXIDINE GLUCONATE 0.12 % MT SOLN: 15.0000 mL | Freq: Once | OROMUCOSAL | Status: AC

## 2020-06-22 MED ORDER — DEXMEDETOMIDINE (PRECEDEX) IN NS 20 MCG/5ML (4 MCG/ML) IV SYRINGE
PREFILLED_SYRINGE | INTRAVENOUS | Status: DC | PRN
  Administered 2020-06-22: 12 ug via INTRAVENOUS

## 2020-06-22 MED ORDER — MIDAZOLAM HCL 2 MG/2ML IJ SOLN
INTRAMUSCULAR | Status: AC
  Filled 2020-06-22: qty 2

## 2020-06-22 MED ORDER — BUPIVACAINE HCL (PF) 0.5 % IJ SOLN
INTRAMUSCULAR | Status: DC | PRN
  Administered 2020-06-22: 20 mL

## 2020-06-22 MED ORDER — GLYCOPYRROLATE 0.2 MG/ML IJ SOLN
INTRAMUSCULAR | Status: AC
  Filled 2020-06-22: qty 1

## 2020-06-22 MED ORDER — PROPOFOL 500 MG/50ML IV EMUL
INTRAVENOUS | Status: AC
  Filled 2020-06-22: qty 100

## 2020-06-22 MED ORDER — LIDOCAINE HCL (CARDIAC) PF 100 MG/5ML IV SOSY
PREFILLED_SYRINGE | INTRAVENOUS | Status: DC | PRN
  Administered 2020-06-22: 60 mg via INTRAVENOUS

## 2020-06-22 MED ORDER — LIDOCAINE HCL (PF) 2 % IJ SOLN
INTRAMUSCULAR | Status: AC
  Filled 2020-06-22: qty 5

## 2020-06-22 SURGICAL SUPPLY — 67 items
ADH SKN CLS APL DERMABOND .7 (GAUZE/BANDAGES/DRESSINGS) ×1
AGENT HMST MTR 8 SURGIFLO (HEMOSTASIS) ×1
APL PRP STRL LF DISP 70% ISPRP (MISCELLANEOUS) ×1
BLADE SURG 15 STRL LF DISP TIS (BLADE) ×1 IMPLANT
BLADE SURG 15 STRL SS (BLADE) ×2
BULB RESERV EVAC DRAIN JP 100C (MISCELLANEOUS) IMPLANT
BUR NEURO DRILL SOFT 3.0X3.8M (BURR) ×2 IMPLANT
CANISTER SUCT 1200ML W/VALVE (MISCELLANEOUS) IMPLANT
CHLORAPREP W/TINT 26 (MISCELLANEOUS) ×2 IMPLANT
COUNTER NEEDLE 20/40 LG (NEEDLE) ×2 IMPLANT
COVER BACK TABLE REUSABLE LG (DRAPES) IMPLANT
COVER LIGHT HANDLE STERIS (MISCELLANEOUS) ×4 IMPLANT
COVER WAND RF STERILE (DRAPES) ×2 IMPLANT
CUP MEDICINE 2OZ PLAST GRAD ST (MISCELLANEOUS) ×4 IMPLANT
DERMABOND ADVANCED (GAUZE/BANDAGES/DRESSINGS) ×1
DERMABOND ADVANCED .7 DNX12 (GAUZE/BANDAGES/DRESSINGS) ×1 IMPLANT
DRAIN CHANNEL JP 10F RND 20C F (MISCELLANEOUS) IMPLANT
DRAPE 3/4 80X56 (DRAPES) ×2 IMPLANT
DRAPE C ARM PK CFD 31 SPINE (DRAPES) ×2 IMPLANT
DRAPE INCISE IOBAN 66X45 STRL (DRAPES) IMPLANT
DRAPE LAPAROTOMY 100X77 ABD (DRAPES) ×2 IMPLANT
DRAPE MICROSCOPE SPINE 48X150 (DRAPES) ×2 IMPLANT
DRAPE POUCH INSTRU U-SHP 10X18 (DRAPES) IMPLANT
DRAPE SURG 17X11 SM STRL (DRAPES) ×8 IMPLANT
ELECT CAUTERY BLADE TIP 2.5 (TIP) ×2
ELECTRODE CAUTERY BLDE TIP 2.5 (TIP) ×1 IMPLANT
FEE INTRAOP MONITOR IMPULS NCS (MISCELLANEOUS) IMPLANT
FRAME EYE SHIELD (PROTECTIVE WEAR) IMPLANT
GAUZE SPONGE 4X4 12PLY STRL (GAUZE/BANDAGES/DRESSINGS) IMPLANT
GLOVE SURG SYN 7.0 (GLOVE) IMPLANT
GLOVE SURG SYN 8.5  E (GLOVE) ×4
GLOVE SURG SYN 8.5 E (GLOVE) ×4 IMPLANT
GLOVE SURG UNDER POLY LF SZ7 (GLOVE) IMPLANT
GOWN SRG XL LVL 3 NONREINFORCE (GOWNS) ×2 IMPLANT
GOWN STRL NON-REIN TWL XL LVL3 (GOWNS) ×4
GOWN STRL REUS W/ TWL LRG LVL3 (GOWN DISPOSABLE) ×1 IMPLANT
GOWN STRL REUS W/ TWL XL LVL3 (GOWN DISPOSABLE) ×1 IMPLANT
GOWN STRL REUS W/TWL LRG LVL3 (GOWN DISPOSABLE) ×2
GOWN STRL REUS W/TWL XL LVL3 (GOWN DISPOSABLE) ×2
GRADUATE 1200CC STRL 31836 (MISCELLANEOUS) ×2 IMPLANT
INTRAOP MONITOR FEE IMPULS NCS (MISCELLANEOUS)
INTRAOP MONITOR FEE IMPULSE (MISCELLANEOUS)
KIT TURNOVER KIT A (KITS) ×2 IMPLANT
MANIFOLD NEPTUNE II (INSTRUMENTS) ×2 IMPLANT
MARKER SKIN DUAL TIP RULER LAB (MISCELLANEOUS) ×4 IMPLANT
NEEDLE HYPO 22GX1.5 SAFETY (NEEDLE) ×2 IMPLANT
NEEDLE SPNL 18GX3.5 QUINCKE PK (NEEDLE) ×6 IMPLANT
NS IRRIG 1000ML POUR BTL (IV SOLUTION) ×2 IMPLANT
PACK LAMINECTOMY NEURO (CUSTOM PROCEDURE TRAY) ×2 IMPLANT
PAD ARMBOARD 7.5X6 YLW CONV (MISCELLANEOUS) ×4 IMPLANT
PIN CASPAR 14 (PIN) IMPLANT
PIN CASPAR 14MM (PIN)
PIN MAYFIELD SKULL DISP (PIN) IMPLANT
SPOGE SURGIFLO 8M (HEMOSTASIS) ×1
SPONGE SURGIFLO 8M (HEMOSTASIS) ×1 IMPLANT
STAPLER SKIN PROX 35W (STAPLE) ×4 IMPLANT
SUT ETHILON 3-0 FS-10 30 BLK (SUTURE)
SUT V-LOC 90 ABS DVC 3-0 CL (SUTURE) ×2 IMPLANT
SUT VIC AB 0 CT1 27 (SUTURE) ×6
SUT VIC AB 0 CT1 27XCR 8 STRN (SUTURE) ×3 IMPLANT
SUT VIC AB 2-0 CT1 18 (SUTURE) ×4 IMPLANT
SUTURE EHLN 3-0 FS-10 30 BLK (SUTURE) IMPLANT
SYR 30ML LL (SYRINGE) ×6 IMPLANT
TAPE CLOTH 3X10 WHT NS LF (GAUZE/BANDAGES/DRESSINGS) ×2 IMPLANT
TOWEL OR 17X26 4PK STRL BLUE (TOWEL DISPOSABLE) ×8 IMPLANT
TRAY FOLEY MTR SLVR 16FR STAT (SET/KITS/TRAYS/PACK) IMPLANT
TUBING CONNECTING 10 (TUBING) ×2 IMPLANT

## 2020-06-22 NOTE — Progress Notes (Signed)
PHARMACY -  BRIEF ANTIBIOTIC NOTE   Pharmacy has received consult(s) for Cefazolin from an OR provider.  The patient's profile has been reviewed for ht/wt/allergies/indication/available labs.    One time order(s) placed for Cefazolin 2gm pre-op based on pt wt.  Further antibiotics/pharmacy consults should be ordered by admitting physician if indicated.        Renda Rolls, PharmD, [facility] 06/22/2020 6:22 AM

## 2020-06-22 NOTE — Discharge Summary (Signed)
Procedure: R C6-7 and C7-T1 foraminotomies Procedure date: 06/22/20 Diagnosis: cervical radiculopathy, R arm weakness    History: Jacqueline Wong is s/p R C67- and C7-T1 foraminotomies.  POD0: Tolerated procedure well. Evaluated in post op recovery still disoriented from anesthesia but able to answer questions and obey commands.   Physical Exam: Vitals:   06/22/20 0614  BP: 116/76  Pulse: 98  Resp: 16  Temp: (!) 97 F (36.1 C)  SpO2: 100%    General: Alert and oriented, sitting in bed Strength:5/5 to RUE, b/l LE, still some weakness to left arm (preexisting), grip 5/5 to LUE, L biceps 4+/5, tricep 4+/5.  Sensation: intact and symmetric throughout  Skin: neck incision clean, dry, intact.   Data:  No results for input(s): NA, K, CL, CO2, BUN, CREATININE, LABGLOM, GLUCOSE, CALCIUM in the last 168 hours. No results for input(s): AST, ALT, ALKPHOS in the last 168 hours.  Invalid input(s): TBILI   No results for input(s): WBC, HGB, HCT, PLT in the last 168 hours. No results for input(s): APTT, INR in the last 168 hours.       Assessment/Plan:  [name] is POD0 s/p C6-T1 foraminotomies. She must remain in PACU until cleared by anesthesia and ambulatory.   She will f/u in clinic. Continue post-op medications as before.  Meade Maw MD Department of Neurosurgery

## 2020-06-22 NOTE — Discharge Instructions (Addendum)
Your surgeon has performed an operation on your neck to relieve pressure on one or more nerves. Many times, patients feel better immediately after surgery and can overdo it. Even if you feel well, it is important that you follow these activity guidelines. If you do not let your neck heal properly from the surgery, you can increase the chance of a disc herniation and/or return of your symptoms. The following are instructions to help in your recovery once you have been discharged from the hospital.   Activity    No bending, lifting, or twisting (BLT). Avoid lifting objects heavier than 10 pounds (gallon milk jug).  Where possible, avoid household activities that involve lifting, bending, pushing, or pulling such as laundry, vacuuming, grocery shopping, and childcare. Try to arrange for help from friends and family for these activities while your back heals.  Increase physical activity slowly as tolerated.  Taking short walks is encouraged, but avoid strenuous exercise. Do not jog, run, bicycle, lift weights, or participate in any other exercises unless specifically allowed by your doctor. Avoid prolonged sitting, including car rides.  Talk to your doctor before resuming sexual activity.  You should not drive until cleared by your doctor.  Until released by your doctor, you should not return to work or school.  You should rest at home and let your body heal.   You may shower two days after your surgery.  After showering, lightly dab your incision dry. Do not take a tub bath or go swimming for 3 weeks, or until approved by your doctor at your follow-up appointment.    Surgical Incision   Your incision is closed with Dermabond glue. The glue should begin to peel away within about a week. If the glue is still in place one week after your surgery, you may gently remove it.  Diet            You may return to your usual diet. Be sure to stay hydrated.  When to Contact us  Although your  surgery and recovery will likely be uneventful, you may have some residual numbness, aches, and pains in your back and/or legs. This is normal and should improve in the next few weeks.  However, should you experience any of the following, contact us immediately:  New numbness or weakness  Pain that is progressively getting worse, and is not relieved by your pain medications or rest  Bleeding, redness, swelling, pain, or drainage from surgical incision  Chills or flu-like symptoms  Fever greater than 101.0 F (38.3 C)  Problems with bowel or bladder functions  Difficulty breathing or shortness of breath  Warmth, tenderness, or swelling in your calf  Contact Information  During office hours (Monday-Friday 9 am to 5 pm), please call your physician at [phone]  After hours and weekends, please call [phone] and speak with the answering service, who will contact the doctor on call.  If that fails, call the [place] Operator at [phone] and ask for the Neurosurgery Resident On Call   For a life-threatening emergency, call [place]   1) The drugs that you were given will stay in your system until tomorrow so for the next 24 hours you should not:  A) Drive an automobile B) Make any legal decisions C) Drink any alcoholic beverage   2) You may resume regular meals tomorrow.  Today it is better to start with liquids and gradually work up to solid foods.  You may  eat anything you prefer, but it is better to start with liquids, then soup and crackers, and gradually work up to solid foods.   3) Please notify your doctor immediately if you have any unusual bleeding, trouble breathing, redness and pain at the surgery site, drainage, fever, or pain not relieved by medication.    4) Additional Instructions:        Please contact your physician with any problems or Same Day Surgery at [phone], Monday through Friday 6 am to 4  pm, or [place] [facility] number at [phone]. Bupivacaine Liposomal Suspension for Injection What is this medicine? BUPIVACAINE LIPOSOMAL (bue PIV a kane LIP oh som al) is an anesthetic. It causes loss of feeling in the skin or other tissues. It is used to prevent and to treat pain from some procedures. This medicine may be used for other purposes; ask your health care provider or pharmacist if you have questions. COMMON BRAND NAME(S): EXPAREL What should I tell my health care provider before I take this medicine? They need to know if you have any of these conditions:  G6PD deficiency  heart disease  kidney disease  liver disease  low blood pressure  lung or breathing disease, like asthma  an unusual or allergic reaction to bupivacaine, other medicines, foods, dyes, or preservatives  pregnant or trying to get pregnant  breast-feeding How should I use this medicine? This medicine is injected into the affected area. It is given by a health care provider in a hospital or clinic setting. Talk to your health care provider about the use of this medicine in children. While it may be given to children as young as 6 years for selected conditions, precautions do apply. Overdosage: If you think you have taken too much of this medicine contact a poison control center or emergency room at once. NOTE: This medicine is only for you. Do not share this medicine with others. What if I miss a dose? This does not apply. What may interact with this medicine? This medicine may interact with the following medications:  acetaminophen  certain antibiotics like dapsone, nitrofurantoin, aminosalicylic acid, sulfonamides  certain medicines for seizures like phenobarbital, phenytoin, valproic acid  chloroquine  cyclophosphamide  flutamide  hydroxyurea  ifosfamide  metoclopramide  nitric oxide  nitroglycerin  nitroprusside  nitrous oxide  other local anesthetics like  lidocaine, pramoxine, tetracaine  primaquine  quinine  rasburicase  sulfasalazine This list may not describe all possible interactions. Give your health care provider a list of all the medicines, herbs, non-prescription drugs, or dietary supplements you use. Also tell them if you smoke, drink alcohol, or use illegal drugs. Some items may interact with your medicine. What should I watch for while using this medicine? Your condition will be monitored carefully while you are receiving this medicine. Be careful to avoid injury while the area is numb, and you are not aware of pain. What side effects may I notice from receiving this medicine? Side effects that you should report to your doctor or health care professional as soon as possible:  allergic reactions like skin rash, itching or hives, swelling of the face, lips, or tongue  seizures  signs and symptoms of a dangerous change in heartbeat or heart rhythm like chest pain; dizziness; fast, irregular heartbeat; palpitations; feeling faint or lightheaded; falls; breathing problems  signs and symptoms of methemoglobinemia such as pale, gray, or blue colored skin; headache; fast heartbeat; shortness of breath; feeling faint or lightheaded, falls; tiredness Side  effects that usually do not require medical attention (report to your doctor or health care professional if they continue or are bothersome):  anxious  back pain  changes in taste  changes in vision  constipation  dizziness  fever  nausea, vomiting This list may not describe all possible side effects. Call your doctor for medical advice about side effects. You may report side effects to FDA at 1-800-FDA-1088. Where should I keep my medicine? This drug is given in a hospital or clinic and will not be stored at home. NOTE: This sheet is a summary. It may not cover all possible information. If you have questions about this medicine, talk to your doctor, pharmacist, or health  care provider.  2021 Elsevier/Gold Standard (2019-08-20 12:24:57)

## 2020-06-22 NOTE — Op Note (Signed)
Indications: Jacqueline Wong is a 50 yo female who presented with worsening R C7 and C8 radiculopathies after an ACDF 5 weeks ago.  She also developed new R arm weakness.  Due to worsening symptoms, surgery was recommended.  Please note that this surgery was performed within the global period of the prior surgery due to new findings of R arm weakness.  Findings: neuroforaminal stenosis C6-7 and C7-T1  Preoperative Diagnosis: Cervical radiculopathy, R arm weakness Postoperative Diagnosis: same   EBL: 50 ml IVF: 800 ml Drains: none Disposition: Extubated and Stable to PACU Complications: none  No foley catheter was placed.   Preoperative Note:   Risks of surgery discussed include: infection, bleeding, stroke, coma, death, paralysis, CSF leak, nerve/spinal cord injury, numbness, tingling, weakness, complex regional pain syndrome, recurrent stenosis and/or disc herniation, vascular injury, development of instability, neck/back pain, need for further surgery, persistent symptoms, development of deformity, and the risks of anesthesia. The patient understood these risks and agreed to proceed.  Operative Note:   OPERATIVE PROCEDURE:  1. Posterior cervical foraminotomies C6-7 and C7-T1 on R  OPERATIVE PROCEDURE:  After induction of general anesthesia, the patient was placed in the prone position on the [place] table and open frame.   A midline incision was then planned using fluoroscopy.  A timeout was performed, and antibiotics given.  Next, the posterior cervical region was prepped and draped in the usual sterile fashion. The incision was injected with local anesthetic, the opened sharply. A subperiosteal dissection was then carried out to expose the right side of the posterior elements at C6, C7, and T1.  Localization was performed with flouroscopy.  We then brought in the microscope.  A self-retaining retractor was placed.    At C6-7, I used the drill to perform a right hemilaminotomy.  The  ligamentum was identified, then removed with a small upgoing curette and 42mm punch.  A combination of 15mm, then 24mm kerrison punches were used to remove the medial facet until the R C7 nerve root was identified.  The 57mm punch was then used to complete the foraminotomy until the nerve root was decompressed.    Hemostasis was achieved, then depomedrol was placed on the nerve root.    We then repositioned the retractor at C7-T1, and confirmed localization with flouroscopy.   At C7-T1, I used the drill to perform a right hemilaminotomy.  The ligamentum was identified, then removed with a small upgoing curette and 38mm punch.  A combination of 26mm, then 64mm kerrison punches were used to remove the medial facet until the R C8 nerve root was identified.  The 44mm punch was then used to complete the foraminotomy until the nerve root was decompressed.     Hemostasis was confirmed.  The area was irrigated.   The wound was closed in a multilayer fashion using interrupted 0 and 2-0 Vicryl sutures.  The final skin edges were reapproximated using a 3-0 monocryl.  Dermabond was placed on the wound for skin closure.   Patient was then handed back over to anesthesia.  All counts were correct at the conclusion of the procedure.     Meade Maw MD

## 2020-06-22 NOTE — H&P (Signed)
History of Present Illness: 06/22/2020 Jacqueline Wong presents today with continued arm weakness and pain in her R arm.    06/15/2020  Jacqueline Wong returns to see me after surgery approximately 1 month ago. After surgery, she noted some change in sensation in the right arm. Previously, her symptoms have been in her left arm. Her left arm now feels good. Over the last several weeks, we have tried a variety of medical interventions to help with her pain, but her pain and grip strength weakness have worsened in her right arm over time. At this point, she is in difficult shape with the amount of weakness she has  04/28/2020 Jacqueline Wong is here today with a chief complaint of neck pain and left arm pain that radiates to her left trapezius and left scapula to the underside of her left arm, into the middle, ring, and little finger of her left hand. She reports weakness in her left arm for about 6 months.  She reports dull and aching pain that can be as bad as 8 out of 10. Sitting and activity make her pain worse. Massage and heat temporally help her. She does report weakness in her arm. She reports dropping items.  Prior to starting physical therapy, she did stretching and exercises for several months. Her physical therapy seems to have exacerbated her symptoms.  Conservative measures: massage, heat Physical therapy: has participated in 4 visits 04/05/20-04/26/20; helped at first, but last visit exacerbated symptoms Multimodal medical therapy including regular antiinflammatories: tylenol, gabapentin, cyclobenzaprine, norco, tramadol Injections: has received epidural steroid injections without relief 04/11/20: left C6-7 TFESI by Dr Alba Destine 03/21/20: left C6-7 TFESI by Dr Alba Destine - 25% improvement 03/03/20: left C6-7 TFESI by Dr Alba Destine - no significant improvement 02/15/20: left shoulder injection - 50% relief  Past Surgery: denies  Jacqueline Wong has no symptoms of cervical myelopathy.  The  symptoms are causing a significant impact on the patient's life.   Initial HPI by Jacqueline Face, Jacqueline Wong on 02/11/20: Jacqueline Wong is a 50 y.o. female who presents with the chief complaint of neck and left arm pain for the last 3 to 4 months, with the neck and lower back pain being present for the last 5 to 28 years. Her neck pain she describes as dull aching tingling and shooting, the pain radiates down the left side of her neck down around her left scapula to the underside of her left arm, ulnar aspect, down into the middle, ring, little finger of her left hand. She also does endorse some lower back pain without radiculopathy. Her symptoms have been worsening, and they are constant. Her pain is currently 3-4 out of 10, at best a 3 out of 10, at worst an 8 out of 10. She does endorse that she has felt "weakness" to her left arm for the last 3 to 4 months. Sitting aggravates her symptoms, while heat, massage temporarily improve her symptoms.  She presents today for treatment and management of her left arm pain, she does not want any surgical intervention however is open to physiatry and physical therapy.   Past Spine Surgery: None  The patient has had no recent trauma, falls, or injury . The patient has no history of cancer  The patient is not experiencing any bowel or bladder dysfunction, new lower extremity weakness, or saddle anesthesia .  Conservative management Conservative measures:massage, heat Physical therapy:none Multimodal medical therapy: tylenol, gabapentin Injections: none  Review of Systems:  A 10 point review of systems  is negative, except for the pertinent positives and negatives detailed in the HPI.  Past Medical History: Past Medical History:  Diagnosis Date  . H/O scoliosis  . Migraine headache   Past Surgical History: Past Surgical History:  Procedure Laterality Date  . C6-T1 ANTERIOR CERVICAL DISCECTOMY AND FUSION 05/18/2020  Dr. Meade Maw,  [facility]  . CESAREAN SECTION  . EYELID SURGERY Bilateral 2018  No Known Allergies  Current Meds  Medication Sig  . acetaminophen (TYLENOL) 500 MG tablet Take 1 tablet (500 mg total) by mouth every 6 (six) hours as needed for mild pain or moderate pain. (Patient taking differently: Take 500 mg by mouth in the morning, at noon, and at bedtime.)  . celecoxib (CELEBREX) 100 MG capsule Take 1 capsule (100 mg total) by mouth 2 (two) times daily.  . cyclobenzaprine (FLEXERIL) 10 MG tablet Take 1 tablet (10 mg total) by mouth 3 (three) times daily as needed for muscle spasms. (Patient taking differently: Take 10 mg by mouth at bedtime as needed for muscle spasms.)  . diphenhydrAMINE (BENADRYL) 25 mg capsule Take 2 capsules (50 mg total) by mouth every 6 (six) hours as needed. (Patient taking differently: Take 50 mg by mouth every 6 (six) hours as needed for allergies.)  . fluticasone (FLONASE) 50 MCG/ACT nasal spray Place 1 spray into both nostrils daily as needed for allergies or rhinitis.  Marland Kitchen gabapentin (NEURONTIN) 300 MG capsule Take 600 mg by mouth 3 (three) times daily.  . ondansetron (ZOFRAN) 4 MG tablet TAKE 1 TABLET(4 MG) BY MOUTH EVERY 8 HOURS AS NEEDED FOR NAUSEA OR VOMITING (Patient taking differently: Take 4 mg by mouth every 8 (eight) hours as needed for nausea or vomiting.)  . oxyCODONE (OXY IR/ROXICODONE) 5 MG immediate release tablet Take 5 mg by mouth in the morning, at noon, and at bedtime.  . Rimegepant Sulfate (NURTEC) 75 MG TBDP Take 75 mg by mouth every other day.    Social History: Social History   Tobacco Use  . Smoking status: Never Smoker  . Smokeless tobacco: Never Used  Vaping Use  . Vaping Use: Never used  Substance Use Topics  . Alcohol use: Yes  Alcohol/week: 1.0 standard drink  Types: 1 Glasses of wine per week  Comment: ocassionally  . Drug use: No   Family Medical History: Family History  Problem Relation Age of Onset  . Cataracts Mother  . No Known  Problems Father  . Glaucoma Maternal Grandmother   Physical Examination:  Vitals:   06/22/20 0614  BP: 116/76  Pulse: 98  Resp: 16  Temp: (!) 97 F (36.1 C)  SpO2: 100%   Heart sounds normal no MRG. Chest Clear to Auscultation Bilaterally.  General: Patient is well developed, well nourished, calm, collected, and in no apparent distress. Attention to examination is appropriate.  Psychiatric: Patient is non-anxious.  Head: Pupils equal, round, and reactive to light.  ENT: Oral mucosa appears well hydrated.  Neck: Supple. Full range of motion.  Respiratory: Patient is breathing without any difficulty.  Extremities: No edema.  Vascular: Palpable dorsal pedal pulses.  Skin: On exposed skin, there are no abnormal skin lesions.  NEUROLOGICAL:   Awake, alert, oriented to person, place, and time. Speech is clear and fluent. Fund of knowledge is appropriate.   Cranial Nerves: Pupils equal round and reactive to light. Facial tone is symmetric. Facial sensation is symmetric. Shoulder shrug is symmetric. Tongue protrusion is midline. There is no pronator drift.  ROM of spine: diminished  rotation to left.  Strength: Side Biceps Triceps Deltoid Interossei Grip Wrist Ext. Wrist Flex.  R 5 4 5  4- 4 3 5   L 5 5 5 5 4 4 5    Side Iliopsoas Quads Hamstring PF DF EHL  R 5 5 5 5 5 5   L 5 5 5 5 5 5    Reflexes are 2+ and symmetric at the biceps, triceps, brachioradialis, patella and achilles. Hoffman's is absent. Clonus is not present. Toes are down-going.  Bilateral upper and lower extremity sensation is intact to light touch.  Gait is normal. No evidence of dysmetria noted.  Medical Decision Making  Imaging: MRI C spine 02/26/2020 IMPRESSION:  1. Multilevel cervical disc degeneration with mild spinal stenosis  from C4-5 to C6-7.  2. Severe left neural foraminal stenosis at C6-7 and severe  bilateral neural foraminal stenosis at C7-T1.   Electronically Signed  By: Logan Bores M.D.  On: 02/27/2020 07:41  MRI L spine 02/25/2020 IMPRESSION:  1. At L4-5, there is abutment of the exiting bilateral L4 nerve  roots, as well as the descending L5 nerve roots.  2. At L5-S1,a central disc protrusion abuts the descending left  greater than right S1 nerve root.  3. Mild left L1-2 and bilateral L2-5 neural foraminal narrowing. No  significant spinal canal narrowing.   Electronically Signed  By: Primitivo Gauze M.D.  On: 02/25/2020 12:41  MRI C spine 06/12/20 IMPRESSION:  1.C6-T1 ACDF. Mild canal stenosis spanning C4-C5 through C6-C7.  2. Suspected severe foraminal stenosis on the left at C6-C7 and  bilaterally at C7-T1. Additional milder foraminal stenosis is  detailed above.  3. Mild prevertebral thickening/edema spanning from C5-T2,  nonspecific but most likely related to recent surgery.   Electronically Signed  By: Margaretha Sheffield MD  On: 06/12/2020 13:04  I have personally reviewed the images and agree with the above interpretation.  Assessment and Plan: Ms. Walthall is a pleasant 50 y.o. female with current right-sided C7 and C8 radiculopathies after prior C6-T1 anterior cervical discectomy and fusion. Her left-sided symptoms are improved. She is now having substantial weakness in her right hand.  Given her worsening weakness, I recommended surgical intervention for decompression of her right C7 and right C8 nerve roots. On my review of her MRI scan, I do think that there may be some stenosis on the right side at C6-7. It is hard to definitively tell this given the presence of prior instrumentation. However, given her right tricep symptoms, I feel it is indicated to decompress at C6-7 as well as C7-T1.   Meade Maw MD, [facility] Department of Neurosurgery

## 2020-06-22 NOTE — Anesthesia Postprocedure Evaluation (Signed)
Anesthesia Post Note  Patient: Jacqueline Wong  Procedure(s) Performed: RIGHT C6-7 AND C7-T1 FORAMINOTOMIES (Right )  Patient location during evaluation: PACU Anesthesia Type: General Level of consciousness: awake and alert Pain management: pain level controlled Vital Signs Assessment: post-procedure vital signs reviewed and stable Respiratory status: spontaneous breathing, nonlabored ventilation, respiratory function stable and patient connected to nasal cannula oxygen Cardiovascular status: blood pressure returned to baseline and stable Postop Assessment: no apparent nausea or vomiting Anesthetic complications: no   No complications documented.   Last Vitals:  Vitals:   06/22/20 1121 06/22/20 1135  BP: 100/75 104/70  Pulse: (!) 105 (!) 111  Resp: 12 16  Temp:  37 C  SpO2: 97% (!) 75%    Last Pain:  Vitals:   06/22/20 1135  TempSrc: Temporal  PainSc: 5                  Precious Haws Montrez [place]

## 2020-06-22 NOTE — Anesthesia Procedure Notes (Signed)
Procedure Name: Intubation Performed by: Fletcher-Harrison, Lian Pounds, CRNA Pre-anesthesia Checklist: Patient identified, Emergency Drugs available, Suction available and Patient being monitored Patient Re-evaluated:Patient Re-evaluated prior to induction Oxygen Delivery Method: Circle system utilized Preoxygenation: Pre-oxygenation with 100% oxygen Induction Type: IV induction Ventilation: Mask ventilation without difficulty Laryngoscope Size: McGraph and 3 Grade View: Grade I Tube type: Oral Tube size: 6.5 mm Number of attempts: 1 Airway Equipment and Method: Stylet and Oral airway Placement Confirmation: ETT inserted through vocal cords under direct vision,  positive ETCO2,  breath sounds checked- equal and bilateral and CO2 detector Secured at: 21 cm Tube secured with: Tape Dental Injury: Teeth and Oropharynx as per pre-operative assessment        

## 2020-06-22 NOTE — Anesthesia Preprocedure Evaluation (Signed)
Anesthesia Evaluation  Patient identified by MRN, date of birth, ID band Patient awake    Reviewed: Allergy & Precautions, H&P , NPO status , Patient's Chart, lab work & pertinent test results  History of Anesthesia Complications Negative for: history of anesthetic complications  Airway Mallampati: II  TM Distance: >3 FB Neck ROM: limited    Dental  (+) Chipped   Pulmonary neg pulmonary ROS, neg sleep apnea, neg COPD,    Pulmonary exam normal        Cardiovascular (-) angina(-) Past MI and (-) Cardiac Stents negative cardio ROS Normal cardiovascular exam(-) dysrhythmias      Neuro/Psych  Headaches, PSYCHIATRIC DISORDERS Anxiety Cervical radiculopathy     GI/Hepatic negative GI ROS, Neg liver ROS, neg GERD  ,  Endo/Other  negative endocrine ROS  Renal/GU Renal diseaseKidney stones     Musculoskeletal  (+) Arthritis ,   Abdominal   Peds  Hematology negative hematology ROS (+)   Anesthesia Other Findings Past Medical History: No date: Allergy No date: Anxiety No date: Arthritis No date: Heart murmur No date: History of kidney stones No date: Kidney stone 1987: Kidney stones No date: Migraine No date: Scoliosis No date: Teeth grinding  Past Surgical History: No date: CESAREAN SECTION     Comment:  x 2 No date: EYE SURGERY     Comment:  EYE LIDS No date: TUBAL LIGATION No date: WISDOM TOOTH EXTRACTION     Reproductive/Obstetrics negative OB ROS                             Anesthesia Physical  Anesthesia Plan  ASA: II  Anesthesia Plan: General ETT   Post-op Pain Management:    Induction: Intravenous  PONV Risk Score and Plan: Ondansetron, Dexamethasone, Midazolam and Treatment may vary due to age or medical condition  Airway Management Planned: Oral ETT and Video Laryngoscope Planned  Additional Equipment:   Intra-op Plan:   Post-operative Plan: Extubation in  OR  Informed Consent: I have reviewed the patients History and Physical, chart, labs and discussed the procedure including the risks, benefits and alternatives for the proposed anesthesia with the patient or authorized representative who has indicated his/her understanding and acceptance.     Dental Advisory Given  Plan Discussed with: Anesthesiologist, CRNA and Surgeon  Anesthesia Plan Comments: (Patient consented for risks of anesthesia including but not limited to:  - adverse reactions to medications - damage to eyes, teeth, lips or other oral mucosa - nerve damage due to positioning  - sore throat or hoarseness - Damage to heart, brain, nerves, lungs, other parts of body or loss of life  Patient voiced understanding.)        Anesthesia Quick Evaluation

## 2020-06-22 NOTE — Transfer of Care (Signed)
Immediate Anesthesia Transfer of Care Note  Patient: Jacqueline Wong  Procedure(s) Performed: RIGHT C6-7 AND C7-T1 FORAMINOTOMIES (Right )  Patient Location: PACU  Anesthesia Type:General  Level of Consciousness: awake, drowsy and patient cooperative  Airway & Oxygen Therapy: Patient Spontanous Breathing and Patient connected to face mask oxygen  Post-op Assessment: Report given to RN and Post -op Vital signs reviewed and stable  Post vital signs: Reviewed and stable  Last Vitals:  Vitals Value Taken Time  BP 125/98 06/22/20 0956  Temp    Pulse 114 06/22/20 1000  Resp 18 06/22/20 0958  SpO2 97 % 06/22/20 1000  Vitals shown include unvalidated device data.  Last Pain:  Vitals:   06/22/20 0614  TempSrc: Oral  PainSc: 6          Complications: No complications documented.

## 2022-12-21 IMAGING — RF DG C-ARM 1-60 MIN
1 series · 5 of 5 positions shown · non-contrast
Comparison: January 27, 2020

CLINICAL DATA: Anterior fusion

EXAM:
DG C-ARM 1-60 MIN; CERVICAL SPINE - 2-3 VIEW
FLUOROSCOPY TIME:  Fluoroscopy Time: 0 minutes 4 seconds
Number of Acquired Spot Images: 5

[Series 1: dg no report - auto finalize · 0.20mm/px · 5 of 5 slices shown]
[im 1/5]
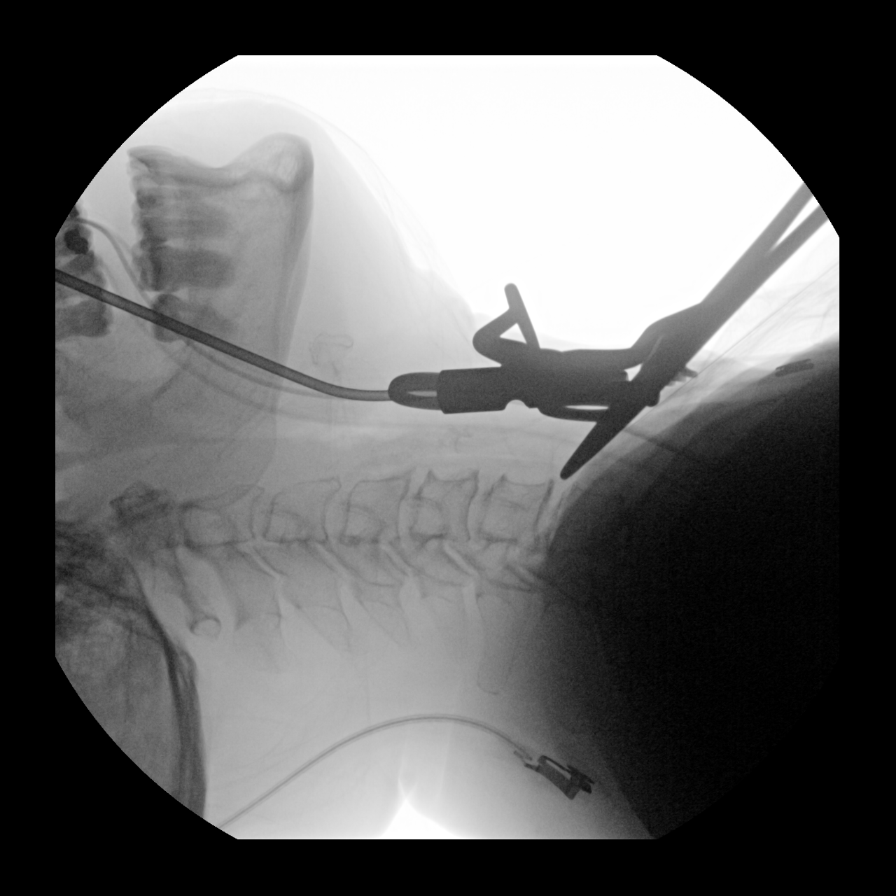
[im 2/5]
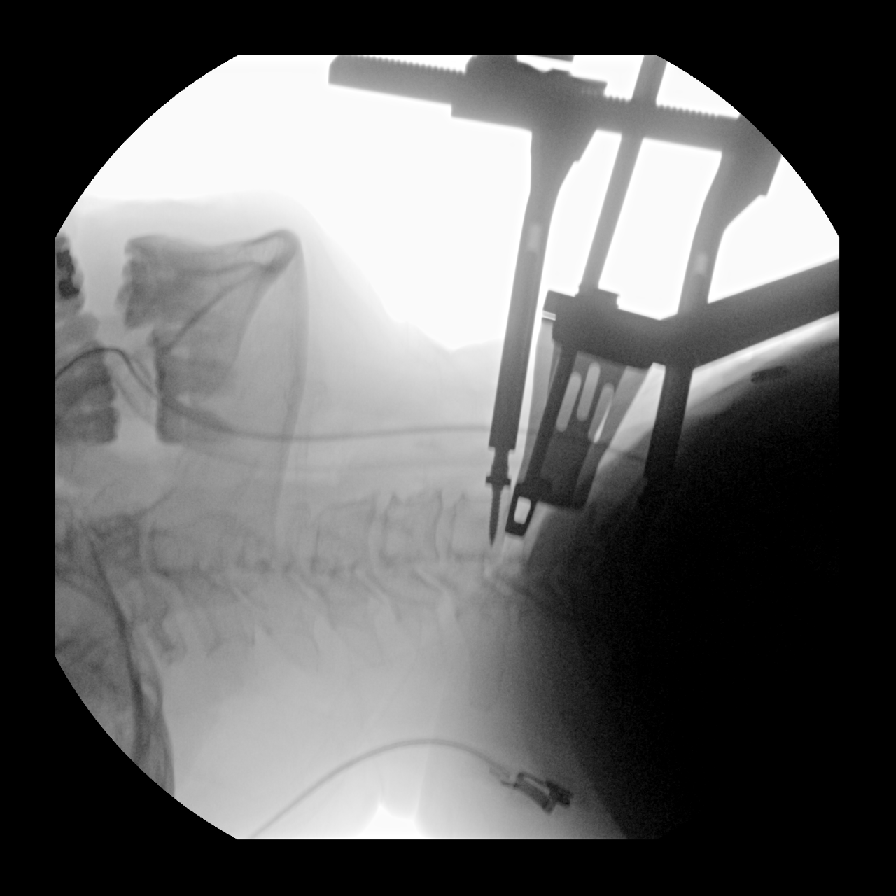
[im 3/5]
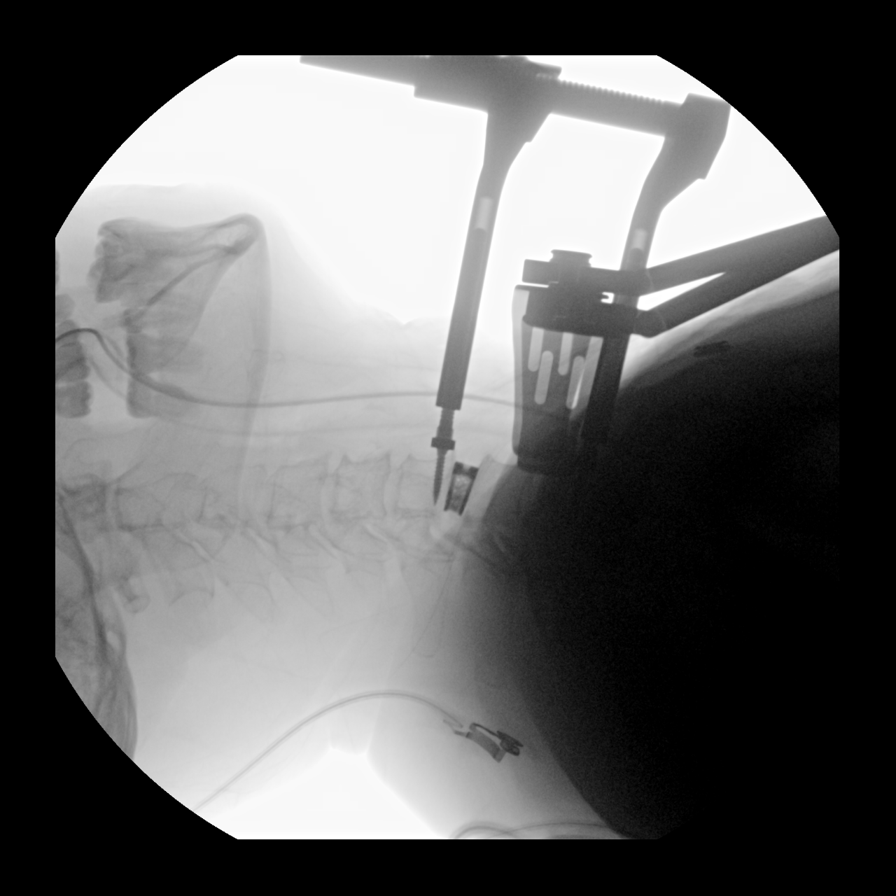
[im 4/5]
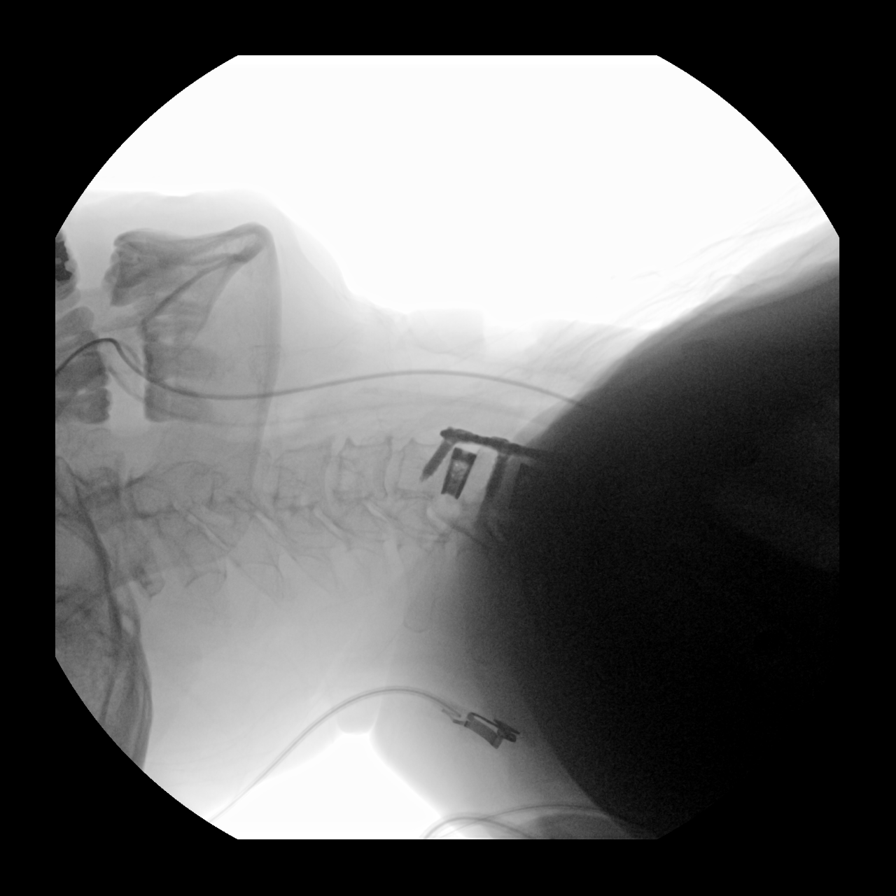
[im 5/5]
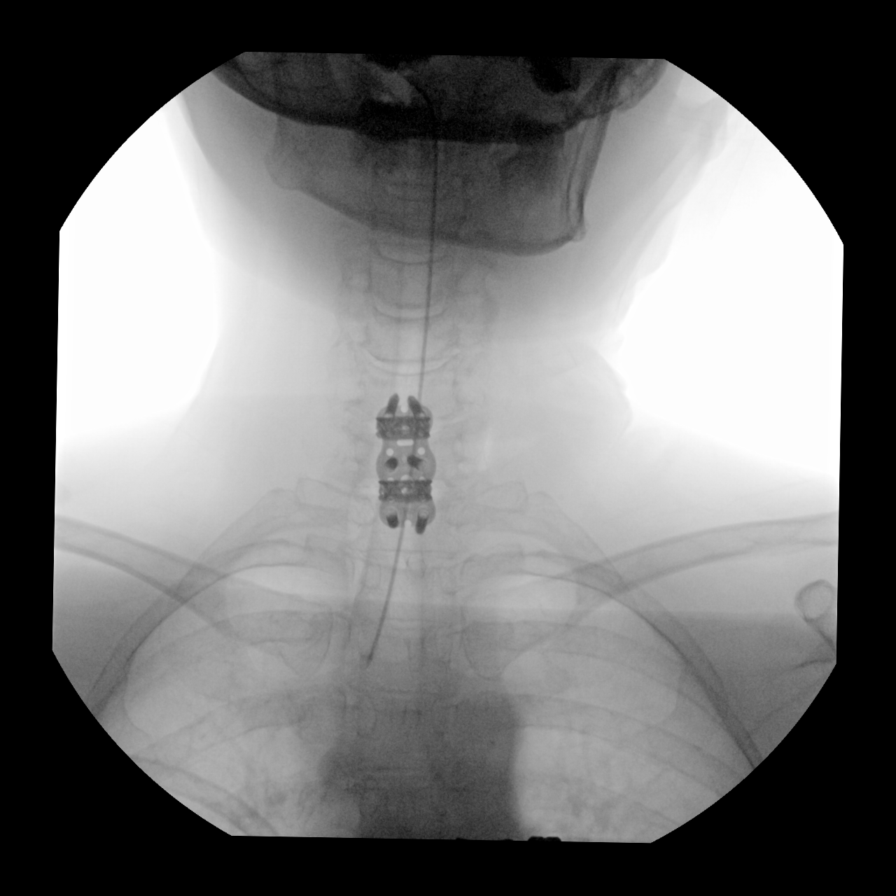

[5 of 5 positions shown; findings below may reference images not displayed]

FINDINGS: Initial cross-table lateral image showed a probe located anterior to
the C6-7 level. Subsequent lateral image shows placement of disc
spacer at C6-7. Final submitted frontal and lateral images show
anterior screw and plate fixation at C6, C7, and T1 with disc
spacers at C6-7 and C7-T1. No fracture or spondylolisthesis. Note
that endotracheal tube tip is in the mid tracheal region.
IMPRESSION: Anterior screw and plate fixation from C6-T1 with disc spacers at
C6-7 and C7-T1. Support hardware intact. No fracture or
spondylolisthesis.
